# Patient Record
Sex: Female | Born: 1975 | Race: Black or African American | Hispanic: No | Marital: Married | Smoking: Former smoker
Health system: Southern US, Community
[De-identification: ages and names within clinical notes are randomized; demographics above are authoritative.]

## PROBLEM LIST (undated history)

## (undated) ENCOUNTER — Inpatient Hospital Stay (HOSPITAL_COMMUNITY): Payer: Self-pay

## (undated) DIAGNOSIS — H05119 Granuloma of unspecified orbit: Secondary | ICD-10-CM

## (undated) DIAGNOSIS — A599 Trichomoniasis, unspecified: Secondary | ICD-10-CM

## (undated) DIAGNOSIS — F419 Anxiety disorder, unspecified: Secondary | ICD-10-CM

## (undated) DIAGNOSIS — R51 Headache: Secondary | ICD-10-CM

## (undated) DIAGNOSIS — M2550 Pain in unspecified joint: Secondary | ICD-10-CM

## (undated) DIAGNOSIS — Z8719 Personal history of other diseases of the digestive system: Secondary | ICD-10-CM

## (undated) DIAGNOSIS — B019 Varicella without complication: Secondary | ICD-10-CM

## (undated) DIAGNOSIS — Z889 Allergy status to unspecified drugs, medicaments and biological substances status: Secondary | ICD-10-CM

## (undated) DIAGNOSIS — K219 Gastro-esophageal reflux disease without esophagitis: Secondary | ICD-10-CM

## (undated) DIAGNOSIS — J40 Bronchitis, not specified as acute or chronic: Secondary | ICD-10-CM

## (undated) DIAGNOSIS — Z6841 Body Mass Index (BMI) 40.0 and over, adult: Secondary | ICD-10-CM

## (undated) DIAGNOSIS — M1990 Unspecified osteoarthritis, unspecified site: Secondary | ICD-10-CM

## (undated) DIAGNOSIS — M549 Dorsalgia, unspecified: Secondary | ICD-10-CM

## (undated) DIAGNOSIS — IMO0002 Reserved for concepts with insufficient information to code with codable children: Secondary | ICD-10-CM

## (undated) DIAGNOSIS — Z8489 Family history of other specified conditions: Secondary | ICD-10-CM

## (undated) DIAGNOSIS — O26849 Uterine size-date discrepancy, unspecified trimester: Secondary | ICD-10-CM

## (undated) HISTORY — DX: Pain in unspecified joint: M25.50

## (undated) HISTORY — DX: Morbid (severe) obesity due to excess calories: E66.01

## (undated) HISTORY — DX: Body Mass Index (BMI) 40.0 and over, adult: Z684

## (undated) HISTORY — DX: Anxiety disorder, unspecified: F41.9

## (undated) HISTORY — DX: Trichomoniasis, unspecified: A59.9

## (undated) HISTORY — DX: Dorsalgia, unspecified: M54.9

## (undated) HISTORY — DX: Headache: R51

## (undated) HISTORY — PX: OTHER SURGICAL HISTORY: SHX169

## (undated) HISTORY — DX: Reserved for concepts with insufficient information to code with codable children: IMO0002

## (undated) HISTORY — DX: Uterine size-date discrepancy, unspecified trimester: O26.849

## (undated) HISTORY — DX: Varicella without complication: B01.9

---

## 1996-03-10 DIAGNOSIS — R87619 Unspecified abnormal cytological findings in specimens from cervix uteri: Secondary | ICD-10-CM

## 1996-03-10 DIAGNOSIS — IMO0002 Reserved for concepts with insufficient information to code with codable children: Secondary | ICD-10-CM

## 1996-03-10 HISTORY — DX: Reserved for concepts with insufficient information to code with codable children: IMO0002

## 1996-03-10 HISTORY — DX: Unspecified abnormal cytological findings in specimens from cervix uteri: R87.619

## 1997-06-13 ENCOUNTER — Ambulatory Visit (HOSPITAL_COMMUNITY): Admission: RE | Admit: 1997-06-13 | Discharge: 1997-06-13 | Payer: Self-pay | Admitting: Family Medicine

## 1998-02-09 ENCOUNTER — Inpatient Hospital Stay (HOSPITAL_COMMUNITY): Admission: AD | Admit: 1998-02-09 | Discharge: 1998-02-09 | Payer: Self-pay | Admitting: *Deleted

## 1998-02-10 ENCOUNTER — Inpatient Hospital Stay (HOSPITAL_COMMUNITY): Admission: AD | Admit: 1998-02-10 | Discharge: 1998-02-10 | Payer: Self-pay | Admitting: *Deleted

## 1998-12-12 ENCOUNTER — Inpatient Hospital Stay (HOSPITAL_COMMUNITY): Admission: AD | Admit: 1998-12-12 | Discharge: 1998-12-12 | Payer: Self-pay | Admitting: Obstetrics

## 2008-06-14 ENCOUNTER — Encounter: Admission: RE | Admit: 2008-06-14 | Discharge: 2008-06-14 | Payer: Self-pay | Admitting: Neurology

## 2008-06-20 ENCOUNTER — Ambulatory Visit (HOSPITAL_COMMUNITY): Admission: RE | Admit: 2008-06-20 | Discharge: 2008-06-20 | Payer: Self-pay | Admitting: Neurology

## 2009-02-14 ENCOUNTER — Encounter: Admission: RE | Admit: 2009-02-14 | Discharge: 2009-03-07 | Payer: Self-pay | Admitting: Internal Medicine

## 2011-07-29 ENCOUNTER — Emergency Department (HOSPITAL_COMMUNITY)
Admission: EM | Admit: 2011-07-29 | Discharge: 2011-07-29 | Disposition: A | Discharge status: Home / Self Care | Payer: BC Managed Care – PPO | Attending: Emergency Medicine | Admitting: Emergency Medicine

## 2011-07-29 DIAGNOSIS — R109 Unspecified abdominal pain: Secondary | ICD-10-CM | POA: Insufficient documentation

## 2011-07-29 DIAGNOSIS — E669 Obesity, unspecified: Secondary | ICD-10-CM | POA: Insufficient documentation

## 2011-07-29 DIAGNOSIS — N949 Unspecified condition associated with female genital organs and menstrual cycle: Secondary | ICD-10-CM | POA: Insufficient documentation

## 2011-07-29 LAB — URINALYSIS, ROUTINE W REFLEX MICROSCOPIC
Bilirubin Urine: NEGATIVE
Ketones, ur: NEGATIVE mg/dL
Nitrite: NEGATIVE
Protein, ur: NEGATIVE mg/dL
Urobilinogen, UA: 0.2 mg/dL (ref 0.0–1.0)
pH: 6.5 (ref 5.0–8.0)

## 2011-07-29 MED ORDER — IBUPROFEN 200 MG PO TABS
600.0000 mg | ORAL_TABLET | Freq: Once | ORAL | Status: AC
  Administered 2011-07-29: 600 mg via ORAL
  Filled 2011-07-29: qty 3

## 2011-07-29 MED ORDER — IBUPROFEN 600 MG PO TABS: 600.0000 mg | ORAL_TABLET | Freq: Four times a day (QID) | ORAL | Status: AC | PRN

## 2011-07-29 MED ORDER — HYDROCODONE-ACETAMINOPHEN 5-325 MG PO TABS
1.0000 | ORAL_TABLET | Freq: Once | ORAL | Status: AC
  Administered 2011-07-29: 1 via ORAL
  Filled 2011-07-29: qty 1

## 2011-07-29 MED ORDER — HYDROCODONE-ACETAMINOPHEN 5-325 MG PO TABS: 2.0000 | ORAL_TABLET | Freq: Once | ORAL | Status: DC

## 2011-07-29 NOTE — ED Notes (Signed)
abd pain, low right side- states happens during period, but not on period now. Periods are regular, heavy bleeding with cramping. Has had constipation for past couple days.

## 2011-07-29 NOTE — ED Provider Notes (Addendum)
History     CSN: [id number]  Arrival date & time 07/29/11  4098   First MD Initiated Contact with Patient 07/29/11 0848      Chief Complaint  Patient presents with  . Abdominal Pain    (Consider location/radiation/quality/duration/timing/severity/associated sxs/prior treatment) Patient is a 36 y.o. female presenting with abdominal pain. The history is provided by the patient.  Abdominal Pain The primary symptoms of the illness include abdominal pain. The primary symptoms of the illness do not include fever, shortness of breath, vomiting, diarrhea, dysuria, vaginal discharge or vaginal bleeding.  pt c/o lower abd/pelvic cramping pain every month for past 3-4 months at/around time of period. States is due to start period now, but no vaginal bleeding or discharge. lnmp 4/20-26. States pain will be bil pelvis/lower abd region, occasionally in lower back. No dysuria, urgency or freq. No cva area pain. No fever or chills. Has had normal appetite including today, no anorexia. No nvd. Had normal bm yesterday. No focal/unilateral pain. Denies hx ovarian cysts, uterine fibroids or endometriosis.  No past medical history on file.  No past surgical history on file.  No family history on file.  History  Substance Use Topics  . Smoking status: Not on file  . Smokeless tobacco: Not on file  . Alcohol Use: Not on file    OB History    No data available      Review of Systems  Constitutional: Negative for fever.  Respiratory: Negative for cough and shortness of breath.   Cardiovascular: Negative for chest pain.  Gastrointestinal: Positive for abdominal pain. Negative for vomiting and diarrhea.  Genitourinary: Negative for dysuria, vaginal bleeding and vaginal discharge.    Allergies  Fruit & vegetable daily  Home Medications   Current Outpatient Rx  Name Route Sig Dispense Refill  . ACETAMINOPHEN 500 MG PO TABS Oral Take 500 mg by mouth every 6 (six) hours as needed. pain    .  DICLOFENAC SODIUM 75 MG PO TBEC Oral Take 75 mg by mouth 2 (two) times daily.    Marland Kitchen HYDROCODONE-ACETAMINOPHEN 5-325 MG PO TABS Oral Take 1 tablet by mouth every 6 (six) hours as needed.    . ADULT MULTIVITAMIN W/MINERALS CH Oral Take 1 tablet by mouth daily.    Marland Kitchen OMEPRAZOLE 40 MG PO CPDR Oral Take 40 mg by mouth daily.    Marland Kitchen SIMETHICONE 80 MG PO CHEW Oral Chew 80 mg by mouth every 6 (six) hours as needed. gas    . TOPIRAMATE 25 MG PO TABS Oral Take 25 mg by mouth 2 (two) times daily.      BP 127/77  Pulse 90  Temp(Src) 97.7 F (36.5 C) (Oral)  Resp 20  SpO2 99%  Physical Exam  Nursing note and vitals reviewed. Constitutional: She is oriented to person, place, and time. She appears well-developed and well-nourished. No distress.  Eyes: Conjunctivae are normal. No scleral icterus.  Neck: Neck supple. No tracheal deviation present.  Cardiovascular: Normal rate, regular rhythm, normal heart sounds and intact distal pulses.   Pulmonary/Chest: Effort normal and breath sounds normal. No respiratory distress.  Abdominal: Soft. Normal appearance and bowel sounds are normal. She exhibits no distension and no mass. There is no tenderness. There is no rebound and no guarding.       Obese. No hernia.   Genitourinary:       No cva tenderness  Musculoskeletal: She exhibits no edema.  Neurological: She is alert and oriented to person, place, and time.  Skin: Skin is warm and dry. No rash noted.  Psychiatric: She has a normal mood and affect.    ED Course  Procedures (including critical care time)   Labs Reviewed  URINALYSIS, ROUTINE W REFLEX MICROSCOPIC   Results for orders placed during the hospital encounter of 07/29/11  URINALYSIS, ROUTINE W REFLEX MICROSCOPIC      Component Value Range   Color, Urine YELLOW  YELLOW    APPearance CLEAR  CLEAR    Specific Gravity, Urine 1.004 (*) 1.005 - 1.030    pH 6.5  5.0 - 8.0    Glucose, UA NEGATIVE  NEGATIVE (mg/dL)   Hgb urine dipstick NEGATIVE   NEGATIVE    Bilirubin Urine NEGATIVE  NEGATIVE    Ketones, ur NEGATIVE  NEGATIVE (mg/dL)   Protein, ur NEGATIVE  NEGATIVE (mg/dL)   Urobilinogen, UA 0.2  0.0 - 1.0 (mg/dL)   Nitrite NEGATIVE  NEGATIVE    Leukocytes, UA NEGATIVE  NEGATIVE   POCT PREGNANCY, URINE      Component Value Range   Preg Test, Ur NEGATIVE  NEGATIVE         MDM  Labs.  Pt says has ride, did not drive, has not taken anything for pain today.  Reports normal appetite, no fever, abd soft nt.    u preg neg. Feel symptoms most likely related to menstrual cycles, given heavy periods/pain w periods, ?fibroids.   Discussed need for gyn followup.   vicodin 1 po, motrin po.   Discussed diff dx w pt, incl possible uterine fibroids, to return if new symptoms, fevers, worsening pain, vomitnig.      Suzi Roots, MD 07/29/11 4098  Suzi Roots, MD 07/29/11 [phone]

## 2011-07-29 NOTE — Discharge Instructions (Signed)
Take motrin as need for pain. You may also take your vicodin as need for pain. No driving for the next 6 hours or when taking vicodin. Also, do not take tylenol or acetaminophen containing medication when taking vicodin. It is possible that your symptoms could be the result of uterine fibroids, follow up with ob/gyn doctor in the next 1-2 weeks. Return to ER if worse, new symptoms, fevers, persistent vomiting, worsening/severe pain, other concern.       Abdominal Pain Abdominal pain can be caused by many things. Your caregiver decides the seriousness of your pain by an examination and possibly blood tests and X-rays. Many cases can be observed and treated at home. Most abdominal pain is not caused by a disease and will probably improve without treatment. However, in many cases, more time must pass before a clear cause of the pain can be found. Before that point, it may not be known if you need more testing, or if hospitalization or surgery is needed. HOME CARE INSTRUCTIONS   Do not take laxatives unless directed by your caregiver.   Take pain medicine only as directed by your caregiver.   Only take over-the-counter or prescription medicines for pain, discomfort, or fever as directed by your caregiver.   Try a clear liquid diet (broth, tea, or water) for as long as directed by your caregiver. Slowly move to a bland diet as tolerated.  SEEK IMMEDIATE MEDICAL CARE IF:   The pain does not go away.   You have a fever.   You keep throwing up (vomiting).   The pain is felt only in portions of the abdomen. Pain in the right side could possibly be appendicitis. In an adult, pain in the left lower portion of the abdomen could be colitis or diverticulitis.   You pass bloody or black tarry stools.  MAKE SURE YOU:   Understand these instructions.   Will watch your condition.   Will get help right away if you are not doing well or get worse.  Document Released: 12/04/2004 Document Revised:  02/13/2011 Document Reviewed: 10/13/2007 [facility] Patient Information 2012 [place], [place].      Fibroids You have been diagnosed as having a fibroid. Fibroids are smooth muscle lumps (tumors) which can occur any place in a woman's body. They are usually in the womb (uterus). The most common problem (symptom) of fibroids is bleeding. Over time this may cause low red blood cells (anemia). Other symptoms include feelings of pressure and pain in the pelvis. The diagnosis (learning what is wrong) of fibroids is made by physical exam. Sometimes tests such as an ultrasound are used. This is helpful when fibroids are felt around the ovaries and to look for tumors. TREATMENT   Most fibroids do not need surgical or medical treatment. Sometimes a tissue sample (biopsy) of the lining of the uterus is done to rule out cancer. If there is no cancer and only a small amount of bleeding, the problem can be watched.   Hormonal treatment can improve the problem.   When surgery is needed, it can consist of removing the fibroid. Vaginal birth may not be possible after the removal of fibroids. This depends on where they are and the extent of surgery. When pregnancy occurs with fibroids it is usually normal.   Your caregiver can help decide which treatments are best for you.  HOME CARE INSTRUCTIONS   Do not use aspirin as this may increase bleeding problems.   If your periods (menses) are heavy,  record the number of pads or tampons used per month. Bring this information to your caregiver. This can help them determine the best treatment for you.  SEEK IMMEDIATE MEDICAL CARE IF:  You have pelvic pain or cramps not controlled with medications, or experience a sudden increase in pain.   You have an increase of pelvic bleeding between and during menses.   You feel lightheaded or have fainting spells.   You develop worsening belly (abdominal) pain.  Document Released: 02/22/2000 Document Revised: 02/13/2011  Document Reviewed: 10/14/2007 [facility] Patient Information 2012 [place], [place].

## 2011-07-30 ENCOUNTER — Encounter: Payer: Self-pay | Admitting: Obstetrics and Gynecology

## 2011-07-30 ENCOUNTER — Other Ambulatory Visit: Payer: Self-pay | Admitting: Obstetrics and Gynecology

## 2011-07-30 ENCOUNTER — Ambulatory Visit (INDEPENDENT_AMBULATORY_CARE_PROVIDER_SITE_OTHER): Payer: BC Managed Care – PPO | Admitting: Obstetrics and Gynecology

## 2011-07-30 VITALS — BP 116/82 | Temp 98.3°F | Resp 16 | Ht 66.0 in | Wt 386.0 lb

## 2011-07-30 DIAGNOSIS — Z124 Encounter for screening for malignant neoplasm of cervix: Secondary | ICD-10-CM

## 2011-07-30 DIAGNOSIS — N946 Dysmenorrhea, unspecified: Secondary | ICD-10-CM

## 2011-07-30 DIAGNOSIS — N920 Excessive and frequent menstruation with regular cycle: Secondary | ICD-10-CM

## 2011-07-30 DIAGNOSIS — R102 Pelvic and perineal pain: Secondary | ICD-10-CM

## 2011-07-30 DIAGNOSIS — A599 Trichomoniasis, unspecified: Secondary | ICD-10-CM | POA: Insufficient documentation

## 2011-07-30 DIAGNOSIS — N949 Unspecified condition associated with female genital organs and menstrual cycle: Secondary | ICD-10-CM

## 2011-07-30 HISTORY — DX: Morbid (severe) obesity due to excess calories: E66.01

## 2011-07-30 HISTORY — DX: Dysmenorrhea, unspecified: N94.6

## 2011-07-30 HISTORY — DX: Excessive and frequent menstruation with regular cycle: N92.0

## 2011-07-30 LAB — POCT URINALYSIS DIPSTICK
Ketones, UA: NEGATIVE
Protein, UA: NEGATIVE
Spec Grav, UA: 1.015

## 2011-07-30 NOTE — Progress Notes (Signed)
Last Pap: 2012 WNL: Yes Regular Periods:yes Contraception: Condoms  Monthly Breast exam:no Tetanus<20yrs:yes Nl.Bladder Function:no Daily BMs:yes Healthy Diet:no Calcium:yes Mammogram:no Exercise:Walking sometimes Seatbelt: yes Abuse at home: no Stressful work:yes Sigmoid-colonoscopy: per pt Never Bone Density: No      Vag. Discharge:no Odor:no Fever:no Irreg.Periods:no Dyspareunia:no Dysuria:no Frequency:no Urgency:yes Hematuria:no Kidney stones:no Constipation:yes Diarrhea:yes Rectal Bleeding: no Vomiting:yes Nausea:yes Pregnant:no Fibroids:no "Unsure" Endometriosis:no Hx of Ovarian Cyst:no Hx IUD:no Hx STD-PID:yes Appendectomy:no Gall Bladder Dz:no  Subjective:    Katherine Baldwin is a 36 y.o. female, G0P0000, who presents for evaluation of severe pelvic pain over the last year, primarily with menses.  The pain became so severe yesterday that she presented to the [facility] ED where the evaluation included only a negative pregnancy test and a negative urine.    History   Social History  . Marital Status: Single    Spouse Name: N/A    Number of Children: N/A  . Years of Education: 16   Occupational History  . teacher [employer]   Social History Main Topics  . Smoking status: Current Some Day Smoker -- 0.5 packs/day    Types: Cigarettes  . Smokeless tobacco: Never Used  . Alcohol Use: No  . Drug Use: No  . Sexually Active: Yes    Birth Control/ Protection: Condom   Other Topics Concern  . None   Social History Narrative  . None    Menstrual cycle:   LMP: Patient's last menstrual period was 07/04/2011. Started bleeding 07/29/11 with severe pain           Cycle: flow is excessive with use of 12 pads or tampons on heaviest days,ometimes soiling clothes and linens.  Menses are irregular occurring approximately every 30 days without intermenstrual spotting, usually lasting 3 to 5 days and with severe dysmenorrhea.  She also experienced  low back pain and constipation with menses.  The patient used Nuvaring with some success, but discontinued it because of expense.  The following portions of the patient's history were reviewed and updated as appropriate: allergies, current medications, past family history, past medical history, past social history, past surgical history and problem list.  Review of Systems Pertinent items are noted in HPI. Breast:Negative for breast lump,nipple discharge or nipple retraction Gastrointestinal: Negative for abdominal pain, change in bowel habits or rectal bleeding Urinary:negative   Objective:    BP 116/82  Temp(Src) 98.3 F (36.8 C) (Oral)  Resp 16  Ht 5\' 6"  (1.676 m)  Wt 386 lb (175.088 kg)  BMI 62.30 kg/m2  LMP 07/04/2011    Weight:  Wt Readings from Last 1 Encounters:  07/30/11 386 lb (175.088 kg)          BMI: Body mass index is 62.30 kg/(m^2).  General Appearance: Alert, appropriate appearance for age. No acute distress HEENT: Grossly normal Neck / Thyroid: Supple, no masses, nodes or enlargement Lungs: clear to auscultation bilaterally Back: No CVA tenderness Breast Exam: No masses or nodes.No dimpling, nipple retraction or discharge. Cardiovascular: Regular rate and rhythm. S1, S2, no murmur Gastrointestinal: Soft, non-tender, no masses or organomegaly. Exam is compromised by patient habitus Pelvic Exam: External genitalia: normal general appearance Vaginal: normal mucosa without prolapse or lesions Cervix: bearly visible secondary to patient habitus Adnexa: non palpable Uterus: compromised by patient habitus Rectovaginal: normal rectal, no masses Lymphatic Exam: Non-palpable nodes in neck, clavicular, axillary, or inguinal regions Skin: no rash or abnormalities Neurologic: Normal gait and speech, no tremor  Psychiatric: Alert and oriented, appropriate  affect.   Wet Prep:not applicable Urinalysis:not applicable UPT: Not done   Assessment:    severe dysmenorrhea    Morbid obesity compromising examination Next menstrual irregularity possibly secondary to chronic anovulation Family history of uterine fibroid  rule out endometriosis   Plan:     pap smear high risk HPV Return for pelvic ultrasound or prn STD screening: done, discussed Contraception:no method.  Will discuss at next visit Info re fibroids and endometriosis given      Katherine Baldwin PMD

## 2011-07-30 NOTE — Patient Instructions (Signed)
Endometriosis Endometriosis is a disease that occurs when the endometrium (lining of the uterus) is misplaced outside of its normal location. It may occur in many locations close to the uterus (womb), but commonly on the ovaries, fallopian tubes, vagina (birth canal) and bowel located close to the uterus. Because the uterus sloughs (expels) its lining every month (menses), there is bleeding whereever the endometrial tissue is located. SYMPTOMS  Often there are no symptoms. However, because blood is irritating to tissues not normally exposed to it, when symptoms occur they vary with the location of the misplaced endometrium. Symptoms often include back and abdominal pain. Periods may be heavier and intercourse may be painful. Infertility may be present. You may have all of these symptoms at one time or another or you may have months with no symptoms at all. Although the symptoms occur mainly during menses, they can occur mid-cycle as well, and usually terminate with menopause. DIAGNOSIS  Your caregiver may recommend a blood test and urine test (urinalysis) to help rule out other conditions. Another common test is ultrasound, a painless procedure that uses sound waves to make a sonogram "picture" of abnormal tissue that could be endometriosis. If your bowel movements are painful around your periods, your caregiver may advise a barium enema (an X-ray of the lower bowel), to try to find the source of your pain. This is sometimes confirmed by laparoscopy. Laparoscopy is a procedure where your caregiver looks into your abdomen with a laparoscope (a small pencil sized telescope). Your caregiver may take a tiny piece of tissue (biopsy) from any abnormal tissue to confirm or document your problem. These tissues are sent to the lab and a pathologist looks at them under the microscope to give a microscopic diagnosis. TREATMENT  Once the diagnosis is made, it can be treated by destruction of the misplaced endometrial  tissue using heat (diathermy), laser, cutting (excision), or chemical means. It may also be treated with hormonal therapy. When using hormonal therapy menses are eliminated, therefore eliminating the monthly exposure to blood by the misplaced endometrial tissue. Only in severe cases is it necessary to perform a hysterectomy with removal of the tubes, uterus and ovaries. HOME CARE INSTRUCTIONS   Only take over-the-counter or prescription medicines for pain, discomfort, or fever as directed by your caregiver.   Avoid activities that produce pain, including a physical sexual relationship.   Do not take aspirin as this may increase bleeding when not on hormonal therapy.   See your caregiver for pain or problems not controlled with treatment.  SEEK IMMEDIATE MEDICAL CARE IF:   Your pain is severe and is not responding to pain medicine.   You develop severe nausea and vomiting, or you cannot keep foods down.   Your pain localizes to the right lower part of your abdomen (possible appendicitis).   You have swelling or increasing pain in the abdomen.   You have a fever.   You see blood in your stool.  MAKE SURE YOU:   Understand these instructions.   Will watch your condition.   Will get help right away if you are not doing well or get worse.  Document Released: 02/22/2000 Document Revised: 02/13/2011 Document Reviewed: 10/13/2007 [facility] Patient Information 2012 [place], [place].Fibroids You have been diagnosed as having a fibroid. Fibroids are smooth muscle lumps (tumors) which can occur any place in a woman's body. They are usually in the womb (uterus). The most common problem (symptom) of fibroids is bleeding. Over time this may cause low  red blood cells (anemia). Other symptoms include feelings of pressure and pain in the pelvis. The diagnosis (learning what is wrong) of fibroids is made by physical exam. Sometimes tests such as an ultrasound are used. This is helpful when fibroids are  felt around the ovaries and to look for tumors. TREATMENT   Most fibroids do not need surgical or medical treatment. Sometimes a tissue sample (biopsy) of the lining of the uterus is done to rule out cancer. If there is no cancer and only a small amount of bleeding, the problem can be watched.   Hormonal treatment can improve the problem.   When surgery is needed, it can consist of removing the fibroid. Vaginal birth may not be possible after the removal of fibroids. This depends on where they are and the extent of surgery. When pregnancy occurs with fibroids it is usually normal.   Your caregiver can help decide which treatments are best for you.  HOME CARE INSTRUCTIONS   Do not use aspirin as this may increase bleeding problems.   If your periods (menses) are heavy, record the number of pads or tampons used per month. Bring this information to your caregiver. This can help them determine the best treatment for you.  SEEK IMMEDIATE MEDICAL CARE IF:  You have pelvic pain or cramps not controlled with medications, or experience a sudden increase in pain.   You have an increase of pelvic bleeding between and during menses.   You feel lightheaded or have fainting spells.   You develop worsening belly (abdominal) pain.  Document Released: 02/22/2000 Document Revised: 02/13/2011 Document Reviewed: 10/14/2007 [facility] Patient Information 2012 [place], [place].

## 2011-08-07 LAB — PAP IG, CT-NG NAA, HPV HIGH-RISK
GC Probe Amp: NEGATIVE
HPV DNA High Risk: DETECTED — AB

## 2011-08-14 ENCOUNTER — Other Ambulatory Visit: Payer: Self-pay | Admitting: Obstetrics and Gynecology

## 2011-08-14 ENCOUNTER — Ambulatory Visit (INDEPENDENT_AMBULATORY_CARE_PROVIDER_SITE_OTHER): Payer: BC Managed Care – PPO | Admitting: Obstetrics and Gynecology

## 2011-08-14 ENCOUNTER — Encounter: Payer: Self-pay | Admitting: Obstetrics and Gynecology

## 2011-08-14 ENCOUNTER — Ambulatory Visit (INDEPENDENT_AMBULATORY_CARE_PROVIDER_SITE_OTHER): Payer: BC Managed Care – PPO

## 2011-08-14 VITALS — BP 120/78 | Ht 66.5 in | Wt 398.0 lb

## 2011-08-14 DIAGNOSIS — N946 Dysmenorrhea, unspecified: Secondary | ICD-10-CM

## 2011-08-14 DIAGNOSIS — N949 Unspecified condition associated with female genital organs and menstrual cycle: Secondary | ICD-10-CM

## 2011-08-14 DIAGNOSIS — R102 Pelvic and perineal pain: Secondary | ICD-10-CM

## 2011-08-14 DIAGNOSIS — N920 Excessive and frequent menstruation with regular cycle: Secondary | ICD-10-CM

## 2011-08-14 MED ORDER — LEVONORGESTREL-ETHINYL ESTRAD 0.1-20 MG-MCG PO TABS: ORAL_TABLET | ORAL | Status: DC

## 2011-08-14 NOTE — Progress Notes (Signed)
F/u for US pelvic pain.  ULTRASOUND: No uterine masses or fibroids noted.  Both ovaries nl. No adnexal masses or free fluid  LMP:   Severe cramps, but not as bad as before  IMPRESSION: Severe pelvic pain without evidence of fibroids, cannot rule out endometriosis  RECOMMENDATION: Presume endometriosis and Rx with extended cycle BCP's.. Pt has used Nuvaring in past but did not like it Alesse extended cycle F/U 6 weeks

## 2011-09-25 ENCOUNTER — Encounter: Payer: BC Managed Care – PPO | Admitting: Obstetrics and Gynecology

## 2012-02-11 ENCOUNTER — Encounter (INDEPENDENT_AMBULATORY_CARE_PROVIDER_SITE_OTHER): Payer: Self-pay | Admitting: General Surgery

## 2012-02-11 ENCOUNTER — Ambulatory Visit (INDEPENDENT_AMBULATORY_CARE_PROVIDER_SITE_OTHER): Payer: BC Managed Care – PPO | Admitting: General Surgery

## 2012-02-11 DIAGNOSIS — K2100 Gastro-esophageal reflux disease with esophagitis, without bleeding: Secondary | ICD-10-CM

## 2012-02-11 DIAGNOSIS — Z6841 Body Mass Index (BMI) 40.0 and over, adult: Secondary | ICD-10-CM

## 2012-02-11 NOTE — Progress Notes (Signed)
Patient ID: Katherine Baldwin, female   DOB: June 05, 1975, 36 y.o.   MRN: [id number]  Chief Complaint  Patient presents with  . Bariatric Pre-op    initial    HPI Katherine Baldwin is a 36 y.o. female.  This patient presents for her initial bariatric surgery consultation. She has a BMI of 61 and has struggled with her weight for most of her life. She has obesity-related comorbidities of arthritis, GERD, and pseudotumor cerebri.  She is interested in the lap band. She has tried several diets and weight loss attempts she has been to a nutritionist and to a bariatric Center twice. She has done several other diets including portion controlled and Weight Watchers. She is currently doing a locking video at home for exercise but has trouble with her knees. She has a history of smoking but quit 5 months ago. HPI  Past Medical History  Diagnosis Date  . Trichomonas   . Headache   . Chicken pox   . Abnormal Pap smear 1998    no treatment required  . Morbid obesity with body mass index of 60.0-69.9 in adult     Past Surgical History  Procedure Date  . Spinal tap     Family History  Problem Relation Age of Onset  . Diabetes Paternal Grandfather   . Diabetes Paternal Grandmother   . Diabetes Maternal Grandmother   . Diabetes Maternal Grandfather   . Cancer Father     prostate, gallbladder, prostate breast, stomach  . Arthritis Father     rhematoid  . Diabetes Mother   . Asthma Mother   . Hypertension Mother   . Fibroids Sister   . Thyroid disease Sister     Social History History  Substance Use Topics  . Smoking status: Current Some Day Smoker -- 0.5 packs/day    Types: Cigarettes  . Smokeless tobacco: Never Used  . Alcohol Use: No    Allergies  Allergen Reactions  . Fruit & Vegetable Daily (Nutritional Supplements)     Fruits and uncooked vegetables-itchy mouth and throat    Current Outpatient Prescriptions  Medication Sig Dispense Refill  . acetaminophen (TYLENOL) 500 MG  tablet Take 500 mg by mouth every 6 (six) hours as needed. pain      . diclofenac (VOLTAREN) 75 MG EC tablet Take 75 mg by mouth 2 (two) times daily.      Marland Kitchen HYDROcodone-acetaminophen (NORCO) 5-325 MG per tablet Take 1 tablet by mouth every 6 (six) hours as needed.      Marland Kitchen levonorgestrel-ethinyl estradiol (AVIANE,ALESSE,LESSINA) 0.1-20 MG-MCG tablet One active pill daily for 12 consecutive weeks, then 3 placebo pills.  Restart that regimen again. This will require pt to have 4 packages of pills each 3 months.  4 Package  3  . meloxicam (MOBIC) 7.5 MG tablet       . Multiple Vitamin (MULITIVITAMIN WITH MINERALS) TABS Take 1 tablet by mouth daily.      Marland Kitchen omeprazole (PRILOSEC) 40 MG capsule Take 40 mg by mouth daily.      . simethicone (MYLICON) 80 MG chewable tablet Chew 80 mg by mouth every 6 (six) hours as needed. gas      . topiramate (TOPAMAX) 25 MG tablet Take 25 mg by mouth 2 (two) times daily.        Review of Systems Review of Systems All other review of systems negative or noncontributory except as stated in the HPI  Blood pressure 112/78, pulse 98, temperature 97.7 F (  36.5 C), temperature source Temporal, resp. rate 16, height 5\' 7"  (1.702 m), weight 392 lb 3.2 oz (177.901 kg), SpO2 99.00%.  Physical Exam Physical Exam Physical Exam  Nursing note and vitals reviewed. Constitutional: She is oriented to person, place, and time. She appears well-developed and well-nourished. No distress.  HENT:  Head: Normocephalic and atraumatic.  Mouth/Throat: No oropharyngeal exudate.  Eyes: Conjunctivae and EOM are normal. Pupils are equal, round, and reactive to light. Right eye exhibits no discharge. Left eye exhibits no discharge. No scleral icterus.  Neck: Normal range of motion. Neck supple. No tracheal deviation present.  Cardiovascular: Normal rate, regular rhythm, normal heart sounds and intact distal pulses.   Pulmonary/Chest: Effort normal and breath sounds normal. No stridor. No  respiratory distress. She has no wheezes.  Abdominal: Soft. Bowel sounds are normal. She exhibits no distension and no mass. There is no tenderness. There is no rebound and no guarding.  Musculoskeletal: Normal range of motion. She exhibits no edema and no tenderness.  Neurological: She is alert and oriented to person, place, and time.  Skin: Skin is warm and dry. No rash noted. She is not diaphoretic. No erythema. No pallor.  Psychiatric: She has a normal mood and affect. Her behavior is normal. Judgment and thought content normal.     Data Reviewed   Assessment    morbid obesity with a BMI of 61 and comorbidities of arthritis, GERD, pseudotumor cerebri As we discussion 45 minutes about the medical and surgical options for weight loss. We discussed the LAP-BAND, sleeve gastrectomy, and the Roux-en-Y gastric bypass and the pros and cons of each.  She was originally interested in lap band but she is leaning towards the sleeve gastrectomy. We discussed the risks of the procedure. The risks of infection, bleeding, pain, scarring, weight regain, too little or too much weight loss, vitamin deficiencies and need for lifelong vitamin supplementation, hair loss, need for protein supplementation, leaks, stricture, reflux, food intolerance, need for reoperation and conversion to roux Y gastric bypass, need for open surgery, injury to spleen or surrounding structures, DVT's, PE, and death again discussed with the patient and the patient expressed understanding and desires to proceed with laparoscopic vertical sleeve gastrectomy, possible open, intraoperative endoscopy. She does have a history of reflux which sounds like it is due to her NSAID use. Because of her need for NSAIDs for her arthritis, gastric bypass may not be the best option for her although this may be the best option for her for overall weight loss and for her reflux. Nothing that she isn't really interested in the lap band anymore due to the  maintenance and she has done some reading about the complications and long-term issues with the band and she is leaning towards the sleeve. We discussed the possibility of worsening reflux and the possible need for conversion to Roux-en-Y gastric bypass. Before the information session, she was not familiar with the sleeve and so I recommended that she do some additional research on the sleeve but we will go ahead and start with her preoperative workup.     Plan    We will go ahead and start with the preoperative evaluation with laboratory studies, upper GI, nutrition and psychology evaluation.       Lodema Pilot DAVID 02/11/2012, 5:15 PM

## 2012-02-14 LAB — CBC WITH DIFFERENTIAL/PLATELET
Eosinophils Absolute: 0.1 10*3/uL (ref 0.0–0.7)
Eosinophils Relative: 1 % (ref 0–5)
Hemoglobin: 12.5 g/dL (ref 12.0–15.0)
Lymphs Abs: 2.9 10*3/uL (ref 0.7–4.0)
MCH: 26.6 pg (ref 26.0–34.0)
MCV: 78.9 fL (ref 78.0–100.0)
Monocytes Absolute: 0.4 10*3/uL (ref 0.1–1.0)
Monocytes Relative: 5 % (ref 3–12)
Platelets: 333 10*3/uL (ref 150–400)
RBC: 4.7 MIL/uL (ref 3.87–5.11)

## 2012-02-14 LAB — COMPREHENSIVE METABOLIC PANEL
BUN: 14 mg/dL (ref 6–23)
CO2: 22 mEq/L (ref 19–32)
Creat: 0.75 mg/dL (ref 0.50–1.10)
Glucose, Bld: 84 mg/dL (ref 70–99)
Total Bilirubin: 0.4 mg/dL (ref 0.3–1.2)

## 2012-02-14 LAB — LIPID PANEL
Cholesterol: 143 mg/dL (ref 0–200)
Total CHOL/HDL Ratio: 3.7 Ratio
Triglycerides: 82 mg/dL (ref <150)
VLDL: 16 mg/dL (ref 0–40)

## 2012-02-16 LAB — H. PYLORI ANTIBODY, IGG: H Pylori IgG: <0.4 {ISR}

## 2012-02-18 ENCOUNTER — Encounter: Payer: BC Managed Care – PPO | Attending: General Surgery | Admitting: *Deleted

## 2012-02-18 ENCOUNTER — Encounter: Payer: Self-pay | Admitting: *Deleted

## 2012-02-18 DIAGNOSIS — Z713 Dietary counseling and surveillance: Secondary | ICD-10-CM | POA: Insufficient documentation

## 2012-02-18 DIAGNOSIS — Z01818 Encounter for other preprocedural examination: Secondary | ICD-10-CM | POA: Insufficient documentation

## 2012-02-18 NOTE — Patient Instructions (Addendum)
   Follow Pre-Op Nutrition Goals to prepare for Gastric Sleeve Surgery.   Call the [facility] at [phone] once you have been given your surgery date to enrolled in the Pre-Op Nutrition Class. You will need to attend this nutrition class 3-4 weeks prior to your surgery.  

## 2012-02-18 NOTE — Progress Notes (Signed)
  Pre-Op Assessment Visit:  Pre-Operative Gastric Sleeve Surgery  Medical Nutrition Therapy:  Appt start time: 1100   End time:  1200.  Patient was seen on 02/18/2012 for Pre-Operative Gastric Sleeve Nutrition Assessment. Assessment and letter of approval faxed to [facility] Bariat[facility] Program coordinator on 02/18/2012.  Approval letter sent to [facility] center and will be available in the chart under the media tab.  Handouts given during visit include:  Pre-Op Goals   Bariatric Surgery Protein Shakes  Patient to call for Pre-Op and Post-Op Nutrition Education at the [facility] when surgery is scheduled.

## 2012-02-20 ENCOUNTER — Ambulatory Visit (INDEPENDENT_AMBULATORY_CARE_PROVIDER_SITE_OTHER): Payer: BC Managed Care – PPO | Admitting: General Surgery

## 2012-02-25 ENCOUNTER — Ambulatory Visit (HOSPITAL_COMMUNITY): Payer: BC Managed Care – PPO

## 2012-03-26 ENCOUNTER — Ambulatory Visit (HOSPITAL_COMMUNITY)
Admission: RE | Admit: 2012-03-26 | Discharge: 2012-03-26 | Disposition: A | Discharge status: Home / Self Care | Payer: BC Managed Care – PPO | Source: Ambulatory Visit | Attending: General Surgery | Admitting: General Surgery

## 2012-03-26 DIAGNOSIS — M129 Arthropathy, unspecified: Secondary | ICD-10-CM | POA: Insufficient documentation

## 2012-03-26 DIAGNOSIS — G932 Benign intracranial hypertension: Secondary | ICD-10-CM | POA: Insufficient documentation

## 2012-03-26 DIAGNOSIS — K219 Gastro-esophageal reflux disease without esophagitis: Secondary | ICD-10-CM | POA: Insufficient documentation

## 2012-03-26 DIAGNOSIS — K449 Diaphragmatic hernia without obstruction or gangrene: Secondary | ICD-10-CM | POA: Insufficient documentation

## 2012-03-26 DIAGNOSIS — Z6841 Body Mass Index (BMI) 40.0 and over, adult: Secondary | ICD-10-CM | POA: Insufficient documentation

## 2012-06-01 ENCOUNTER — Telehealth: Payer: Self-pay | Admitting: *Deleted

## 2012-06-01 ENCOUNTER — Other Ambulatory Visit (INDEPENDENT_AMBULATORY_CARE_PROVIDER_SITE_OTHER): Payer: Self-pay | Admitting: General Surgery

## 2012-06-01 NOTE — Telephone Encounter (Signed)
Left message for patient to return call.

## 2012-06-01 NOTE — Telephone Encounter (Signed)
Patient is calling because she is scheduled to have weight loss sx in June and scheduled to see CM in July and she has some questions about her medicines and her appointment. Please call the patient.

## 2012-07-27 ENCOUNTER — Telehealth (INDEPENDENT_AMBULATORY_CARE_PROVIDER_SITE_OTHER): Payer: Self-pay

## 2012-07-27 NOTE — Telephone Encounter (Signed)
Message copied by Maryan Puls on Tue Jul 27, 2012  8:49 AM ------      Message from: Marnette Burgess      Created: Wed Jul 07, 2012  4:29 PM       PT HAS APPT 08/20/2012 FOR PREOP AND SHE NEEDS TO CHANGE LATE DAY BECAUSE SHE IS IN THE SCHOOL.      PLEASE RETURN PATIENT CALL. ------

## 2012-07-27 NOTE — Telephone Encounter (Signed)
Called and left message for patient to call our office.  Patient requesting to r/s appt to later in the day.  Please make patient aware that the latest appointment is 12:15 on 08/20/12, Dr. Delice Lesch not available any other day before patient surgery.  She will need to keep appt for that day either same time or change to 12:15.

## 2012-08-12 ENCOUNTER — Encounter: Payer: BC Managed Care – PPO | Attending: General Surgery

## 2012-08-19 ENCOUNTER — Ambulatory Visit (INDEPENDENT_AMBULATORY_CARE_PROVIDER_SITE_OTHER): Payer: BC Managed Care – PPO | Admitting: General Surgery

## 2012-08-20 ENCOUNTER — Ambulatory Visit (INDEPENDENT_AMBULATORY_CARE_PROVIDER_SITE_OTHER): Payer: BC Managed Care – PPO | Admitting: General Surgery

## 2012-08-20 ENCOUNTER — Encounter (INDEPENDENT_AMBULATORY_CARE_PROVIDER_SITE_OTHER): Payer: Self-pay | Admitting: General Surgery

## 2012-08-20 ENCOUNTER — Telehealth (INDEPENDENT_AMBULATORY_CARE_PROVIDER_SITE_OTHER): Payer: Self-pay

## 2012-08-20 NOTE — Progress Notes (Signed)
Patient ID: Katherine Baldwin, female   DOB: 05/21/1975, 37 y.o.   MRN: [id number]  Chief Complaint  Patient presents with  . Bariatric Pre-op    pre op gastric sleeve    HPI Katherine Baldwin is a 37 y.o. female. This patient comes in for her preoperative surgery evaluation in preparation for sleeve gastrectomy. She has a BMI of 59 with obesity related comorbidities of GERD, arthritis, and pseudotumor cerebri. She has started her preoperative diet and remains interested in sleeve gastrectomy. She has done a lot of research online and remains interested in the sleeve. She did have reflux on her upper GI and some reflux after coming off of her PPIs. HPI  Past Medical History  Diagnosis Date  . Trichomonas   . Headache(784.0)   . Chicken pox   . Abnormal Pap smear 1998    no treatment required  . Morbid obesity with body mass index of 60.0-69.9 in adult     Past Surgical History  Procedure Laterality Date  . Spinal tap      Family History  Problem Relation Age of Onset  . Diabetes Paternal Grandfather   . Diabetes Paternal Grandmother   . Diabetes Maternal Grandmother   . Diabetes Maternal Grandfather   . Cancer Father     prostate, gallbladder, prostate breast, stomach  . Arthritis Father     rhematoid  . Diabetes Father     Prediabetes  . Diabetes Mother   . Asthma Mother   . Hypertension Mother   . Fibroids Sister   . Thyroid disease Sister     Social History History  Substance Use Topics  . Smoking status: Former Smoker -- 0.50 packs/day    Types: Cigarettes    Quit date: 03/10/2010  . Smokeless tobacco: Never Used  . Alcohol Use: No    Allergies  Allergen Reactions  . Fruit & Vegetable Daily (Nutritional Supplements)     Fruits and uncooked vegetables-itchy mouth and throat. Patient reports this is transient and she never knows which foods will have a rxn    Current Outpatient Prescriptions  Medication Sig Dispense Refill  . acetaminophen (TYLENOL) 500  MG tablet Take 500 mg by mouth every 6 (six) hours as needed. pain      . HYDROcodone-acetaminophen (NORCO) 5-325 MG per tablet Take 1 tablet by mouth every 6 (six) hours as needed.      . Multiple Vitamin (MULITIVITAMIN WITH MINERALS) TABS Take 1 tablet by mouth daily.      . omeprazole (PRILOSEC) 40 MG capsule Take 40 mg by mouth daily.      . topiramate (TOPAMAX) 25 MG tablet Take 25 mg by mouth 2 (two) times daily.      . diclofenac (VOLTAREN) 75 MG EC tablet Take 75 mg by mouth 2 (two) times daily.      . meloxicam (MOBIC) 7.5 MG tablet        No current facility-administered medications for this visit.    Review of Systems Review of Systems All other review of systems negative or noncontributory except as stated in the HPI  Blood pressure 112/84, pulse 76, temperature 97.6 F (36.4 C), temperature source Temporal, resp. rate 16, height 5' 6" (1.676 m), weight 365 lb 3.2 oz (165.654 kg).  Physical Exam Physical Exam Physical Exam  Nursing note and vitals reviewed. Constitutional: She is oriented to person, place, and time. She appears well-developed and well-nourished. No distress.  HENT:  Head: Normocephalic and atraumatic.  Mouth/Throat:   No oropharyngeal exudate.  Eyes: Conjunctivae and EOM are normal. Pupils are equal, round, and reactive to light. Right eye exhibits no discharge. Left eye exhibits no discharge. No scleral icterus.  Neck: Normal range of motion. Neck supple. No tracheal deviation present.  Cardiovascular: Normal rate, regular rhythm, normal heart sounds and intact distal pulses.   Pulmonary/Chest: Effort normal and breath sounds normal. No stridor. No respiratory distress. She has no wheezes.  Abdominal: Soft. Bowel sounds are normal. She exhibits no distension and no mass. There is no tenderness. There is no rebound and no guarding.  Musculoskeletal: Normal range of motion. She exhibits no edema and no tenderness.  Neurological: She is alert and oriented to  person, place, and time.  Skin: Skin is warm and dry. No rash noted. She is not diaphoretic. No erythema. No pallor.  Psychiatric: She has a normal mood and affect. Her behavior is normal. Judgment and thought content normal.    Data Reviewed   Assessment    Morbid obesity with a BMI of 59 and obesity related commodities arthritis, GERD, and pseudotumor cerebri. She remains interested in the vertical sleeve Gastrectomy for obesity. We'll long discussion today regarding the perioperative expectations, surgery, and the risks and benefits of the procedure. The risks of infection, bleeding, pain, scarring, weight regain, too little or too much weight loss, vitamin deficiencies and need for lifelong vitamin supplementation, hair loss, need for protein supplementation, leaks, stricture, reflux, food intolerance, need for reoperation and conversion to roux Y gastric bypass, need for open surgery, injury to spleen or surrounding structures, DVT's, PE, and death again discussed with the patient and the patient expressed understanding. I did discuss with her the possibility of worsening reflux and the need for conversion to Roux-en-Y gastric bypass. She really is not interested in gastric bypass unless required for complications or severe reflux. She did express understanding of the possibility of a worsening reflux and the possibility of inability to treat this with medication. This is certainly a concern for both of us but again, she is not interested in the other procedures in feels the need to lose the weight surgically and thus is remaining interested in a sleeve gastrectomy. She did expressed willingness to proceed with Roux-en-Y gastric bypass in the future if needed for complications.     Plan    She wishes to proceed with her planned procedure.  It should does understand the increased risk for possible worsening reflux and the need for conversion to gastric bypass. She's going to continue on her  preoperative diet and I. Explained that we can change her procedure at any time if desired or if she needs more time to think about this and would be happy to reschedule as well. Otherwise we will proceed with sleeve gastrectomy as scheduled.       Alonzo Loving DAVID 08/20/2012, 10:16 AM    

## 2012-08-20 NOTE — Telephone Encounter (Signed)
Called patient to make her aware to start liquids only 24 hours prior to Laparoscopic sleeve gastrectomy on 08/31/2012.  Patient understood and aware.

## 2012-08-23 ENCOUNTER — Encounter (HOSPITAL_COMMUNITY): Payer: Self-pay | Admitting: Pharmacy Technician

## 2012-08-24 ENCOUNTER — Other Ambulatory Visit (HOSPITAL_COMMUNITY): Payer: Self-pay | Admitting: General Surgery

## 2012-08-24 NOTE — Progress Notes (Addendum)
EKG  1/14 EPIC, office visit note 04/11/11 Dr. [name] on chart

## 2012-08-24 NOTE — Patient Instructions (Addendum)
20 Katherine Baldwin  08/24/2012   Your procedure is scheduled on:  08/31/12  TUESDAY  Report to [facility] at    0515   AM.  Call this number if you have problems the morning of surgery: [phone]       Remember:   Do not eat food  Or drink :After Midnight. Monday NIGHT   Take these medicines the morning of surgery with A SIP OF WATER: hydrocodone if needed   Contacts, dentures or partial plates can not be worn to surgery  Leave suitcase in the car. After surgery it may be brought to your room.  For patients admitted to the hospital, checkout time is 11:00 AM day of  discharge.             SPECIAL INSTRUCTIONS- SEE [facility] PREPARING FOR SURGERY INSTRUCTION SHEET-     DO NOT WEAR JEWELRY, LOTIONS, POWDERS, OR PERFUMES.  WOMEN-- DO NOT SHAVE LEGS OR UNDERARMS FOR 12 HOURS BEFORE SHOWERS.                                                                  Please read over the following fact sheets that you were given: MRSA Information                                                                                  Aine Strycharz  PST [phone]  [phone]                 FAILURE TO FOLLOW THESE INSTRUCTIONS MAY RESULT IN  CANCELLATION   OF YOUR SURGERY                                                  Patient Signature _____________________________

## 2012-08-25 ENCOUNTER — Encounter (HOSPITAL_COMMUNITY): Payer: Self-pay

## 2012-08-25 ENCOUNTER — Encounter (HOSPITAL_COMMUNITY)
Admission: RE | Admit: 2012-08-25 | Discharge: 2012-08-25 | Disposition: A | Discharge status: Home / Self Care | Payer: BC Managed Care – PPO | Source: Ambulatory Visit | Attending: General Surgery | Admitting: General Surgery

## 2012-08-25 DIAGNOSIS — Z01812 Encounter for preprocedural laboratory examination: Secondary | ICD-10-CM | POA: Insufficient documentation

## 2012-08-25 HISTORY — DX: Bronchitis, not specified as acute or chronic: J40

## 2012-08-25 HISTORY — DX: Unspecified osteoarthritis, unspecified site: M19.90

## 2012-08-25 HISTORY — DX: Allergy status to unspecified drugs, medicaments and biological substances: Z88.9

## 2012-08-25 HISTORY — DX: Granuloma of unspecified orbit: H05.119

## 2012-08-25 HISTORY — DX: Gastro-esophageal reflux disease without esophagitis: K21.9

## 2012-08-25 HISTORY — DX: Family history of other specified conditions: Z84.89

## 2012-08-25 LAB — COMPREHENSIVE METABOLIC PANEL
ALT: 23 U/L (ref 0–35)
AST: 23 U/L (ref 0–37)
CO2: 25 mEq/L (ref 19–32)
Calcium: 9.6 mg/dL (ref 8.4–10.5)
Chloride: 102 mEq/L (ref 96–112)
GFR calc non Af Amer: >90 mL/min (ref >90)
Sodium: 137 mEq/L (ref 135–145)
Total Bilirubin: 0.3 mg/dL (ref 0.3–1.2)

## 2012-08-25 LAB — CBC WITH DIFFERENTIAL/PLATELET
Eosinophils Relative: 1 % (ref 0–5)
Lymphocytes Relative: 36 % (ref 12–46)
Monocytes Relative: 6 % (ref 3–12)
Neutrophils Relative %: 57 % (ref 43–77)
Platelets: ADEQUATE 10*3/uL (ref 150–400)
RBC: 4.91 MIL/uL (ref 3.87–5.11)
WBC: 7.4 10*3/uL (ref 4.0–10.5)

## 2012-08-25 LAB — HCG, SERUM, QUALITATIVE: Preg, Serum: NEGATIVE

## 2012-08-30 NOTE — Progress Notes (Signed)
Surgery time change from 0715 to 1415. Pt called and agreed to be at short stay by 1145 on 08/31/12.

## 2012-08-31 ENCOUNTER — Encounter (HOSPITAL_COMMUNITY)
Admission: RE | Disposition: A | Discharge status: Home / Self Care | Payer: Self-pay | Source: Ambulatory Visit | Attending: General Surgery

## 2012-08-31 ENCOUNTER — Encounter (HOSPITAL_COMMUNITY): Payer: Self-pay | Admitting: *Deleted

## 2012-08-31 ENCOUNTER — Inpatient Hospital Stay (HOSPITAL_COMMUNITY)
Admission: RE | Admit: 2012-08-31 | Discharge: 2012-09-02 | DRG: 293 | Disposition: A | Discharge status: Home / Self Care | Payer: BC Managed Care – PPO | Source: Ambulatory Visit | Attending: General Surgery | Admitting: General Surgery

## 2012-08-31 ENCOUNTER — Inpatient Hospital Stay (HOSPITAL_COMMUNITY): Payer: BC Managed Care – PPO | Admitting: Anesthesiology

## 2012-08-31 ENCOUNTER — Encounter (HOSPITAL_COMMUNITY): Payer: Self-pay | Admitting: Anesthesiology

## 2012-08-31 DIAGNOSIS — K2100 Gastro-esophageal reflux disease with esophagitis, without bleeding: Secondary | ICD-10-CM

## 2012-08-31 DIAGNOSIS — Z6841 Body Mass Index (BMI) 40.0 and over, adult: Secondary | ICD-10-CM

## 2012-08-31 DIAGNOSIS — Z87891 Personal history of nicotine dependence: Secondary | ICD-10-CM

## 2012-08-31 DIAGNOSIS — M129 Arthropathy, unspecified: Secondary | ICD-10-CM

## 2012-08-31 DIAGNOSIS — I10 Essential (primary) hypertension: Secondary | ICD-10-CM | POA: Diagnosis present

## 2012-08-31 DIAGNOSIS — K449 Diaphragmatic hernia without obstruction or gangrene: Secondary | ICD-10-CM | POA: Diagnosis present

## 2012-08-31 DIAGNOSIS — K219 Gastro-esophageal reflux disease without esophagitis: Secondary | ICD-10-CM | POA: Diagnosis present

## 2012-08-31 HISTORY — PX: LAPAROSCOPIC GASTRIC SLEEVE RESECTION: SHX5895

## 2012-08-31 SURGERY — GASTRECTOMY, SLEEVE, LAPAROSCOPIC
Anesthesia: General | Site: Abdomen | Wound class: Clean Contaminated

## 2012-08-31 MED ORDER — MORPHINE SULFATE 2 MG/ML IJ SOLN
2.0000 mg | INTRAMUSCULAR | Status: DC | PRN
  Administered 2012-08-31 – 2012-09-01 (×3): 2 mg via INTRAVENOUS
  Administered 2012-09-01 (×3): 4 mg via INTRAVENOUS
  Filled 2012-08-31 (×3): qty 1
  Filled 2012-08-31: qty 2
  Filled 2012-08-31: qty 1
  Filled 2012-08-31: qty 2
  Filled 2012-08-31: qty 1

## 2012-08-31 MED ORDER — UNJURY VANILLA POWDER: 2.0000 [oz_av] | Freq: Four times a day (QID) | ORAL | Status: DC

## 2012-08-31 MED ORDER — MEPERIDINE HCL 50 MG/ML IJ SOLN: 6.2500 mg | INTRAMUSCULAR | Status: DC | PRN

## 2012-08-31 MED ORDER — OXYCODONE-ACETAMINOPHEN 5-325 MG/5ML PO SOLN
5.0000 mL | ORAL | Status: DC | PRN
  Administered 2012-09-01: 5 mL via ORAL
  Administered 2012-09-01 – 2012-09-02 (×3): 10 mL via ORAL
  Filled 2012-08-31: qty 10
  Filled 2012-08-31: qty 5
  Filled 2012-08-31 (×2): qty 10

## 2012-08-31 MED ORDER — ONDANSETRON HCL 4 MG/2ML IJ SOLN: 4.0000 mg | INTRAMUSCULAR | Status: DC | PRN

## 2012-08-31 MED ORDER — PROMETHAZINE HCL 25 MG/ML IJ SOLN
6.2500 mg | INTRAMUSCULAR | Status: DC | PRN
  Administered 2012-08-31: 12.5 mg via INTRAVENOUS

## 2012-08-31 MED ORDER — LIDOCAINE-EPINEPHRINE 1 %-1:100000 IJ SOLN
INTRAMUSCULAR | Status: DC | PRN
  Administered 2012-08-31: 30 mL

## 2012-08-31 MED ORDER — ENOXAPARIN SODIUM 40 MG/0.4ML ~~LOC~~ SOLN
40.0000 mg | Freq: Two times a day (BID) | SUBCUTANEOUS | Status: DC
  Administered 2012-09-01 (×2): 40 mg via SUBCUTANEOUS
  Filled 2012-08-31 (×5): qty 0.4

## 2012-08-31 MED ORDER — PANTOPRAZOLE SODIUM 40 MG IV SOLR
40.0000 mg | INTRAVENOUS | Status: DC
  Administered 2012-08-31 – 2012-09-01 (×2): 40 mg via INTRAVENOUS
  Filled 2012-08-31 (×3): qty 40

## 2012-08-31 MED ORDER — LIDOCAINE HCL (CARDIAC) 20 MG/ML IV SOLN
INTRAVENOUS | Status: DC | PRN
  Administered 2012-08-31: 80 mg via INTRAVENOUS

## 2012-08-31 MED ORDER — GLYCOPYRROLATE 0.2 MG/ML IJ SOLN
INTRAMUSCULAR | Status: DC | PRN
  Administered 2012-08-31: 0.4 mg via INTRAVENOUS

## 2012-08-31 MED ORDER — ROCURONIUM BROMIDE 100 MG/10ML IV SOLN
INTRAVENOUS | Status: DC | PRN
  Administered 2012-08-31: 30 mg via INTRAVENOUS
  Administered 2012-08-31: 50 mg via INTRAVENOUS
  Administered 2012-08-31: 20 mg via INTRAVENOUS

## 2012-08-31 MED ORDER — PROPOFOL INFUSION 10 MG/ML OPTIME: INTRAVENOUS | Status: DC | PRN

## 2012-08-31 MED ORDER — NEOSTIGMINE METHYLSULFATE 1 MG/ML IJ SOLN
INTRAMUSCULAR | Status: DC | PRN
  Administered 2012-08-31: 4 mg via INTRAVENOUS

## 2012-08-31 MED ORDER — UNJURY CHICKEN SOUP POWDER: 2.0000 [oz_av] | Freq: Four times a day (QID) | ORAL | Status: DC

## 2012-08-31 MED ORDER — 0.9 % SODIUM CHLORIDE (POUR BTL) OPTIME
TOPICAL | Status: DC | PRN
  Administered 2012-08-31: 1000 mL

## 2012-08-31 MED ORDER — UNJURY CHOCOLATE CLASSIC POWDER
2.0000 [oz_av] | Freq: Four times a day (QID) | ORAL | Status: DC
  Administered 2012-09-02: 2 [oz_av] via ORAL

## 2012-08-31 MED ORDER — ONDANSETRON HCL 4 MG/2ML IJ SOLN
INTRAMUSCULAR | Status: DC | PRN
  Administered 2012-08-31: 4 mg via INTRAVENOUS

## 2012-08-31 MED ORDER — BUPIVACAINE HCL 0.25 % IJ SOLN
INTRAMUSCULAR | Status: DC | PRN
  Administered 2012-08-31: 30 mL

## 2012-08-31 MED ORDER — ACETAMINOPHEN 160 MG/5ML PO SOLN: 650.0000 mg | ORAL | Status: DC | PRN

## 2012-08-31 MED ORDER — LACTATED RINGERS IV SOLN: INTRAVENOUS | Status: DC | PRN

## 2012-08-31 MED ORDER — ACETAMINOPHEN 10 MG/ML IV SOLN: 1000.0000 mg | Freq: Once | INTRAVENOUS | Status: DC | PRN

## 2012-08-31 MED ORDER — HEPARIN SODIUM (PORCINE) 5000 UNIT/ML IJ SOLN
5000.0000 [IU] | Freq: Once | INTRAMUSCULAR | Status: AC
  Administered 2012-08-31: 5000 [IU] via SUBCUTANEOUS
  Filled 2012-08-31: qty 1

## 2012-08-31 MED ORDER — OXYCODONE HCL 5 MG PO TABS
5.0000 mg | ORAL_TABLET | Freq: Once | ORAL | Status: DC | PRN
Start: 2012-08-31 — End: 2012-08-31

## 2012-08-31 MED ORDER — TISSEEL VH 10 ML EX KIT
PACK | CUTANEOUS | Status: DC | PRN
  Administered 2012-08-31: 1

## 2012-08-31 MED ORDER — SODIUM CHLORIDE 0.9 % IV SOLN
1.0000 g | Freq: Once | INTRAVENOUS | Status: AC
  Administered 2012-08-31: 1 g via INTRAVENOUS

## 2012-08-31 MED ORDER — LACTATED RINGERS IV SOLN
INTRAVENOUS | Status: DC
  Administered 2012-08-31: 1000 mL via INTRAVENOUS
  Administered 2012-08-31 (×2): via INTRAVENOUS

## 2012-08-31 MED ORDER — FENTANYL CITRATE 0.05 MG/ML IJ SOLN
INTRAMUSCULAR | Status: DC | PRN
  Administered 2012-08-31 (×5): 50 ug via INTRAVENOUS

## 2012-08-31 MED ORDER — KCL IN DEXTROSE-NACL 20-5-0.45 MEQ/L-%-% IV SOLN
INTRAVENOUS | Status: DC
  Administered 2012-08-31 – 2012-09-02 (×5): via INTRAVENOUS
  Filled 2012-08-31 (×7): qty 1000

## 2012-08-31 MED ORDER — PHENYLEPHRINE HCL 10 MG/ML IJ SOLN
INTRAMUSCULAR | Status: DC | PRN
  Administered 2012-08-31: 80 ug via INTRAVENOUS
  Administered 2012-08-31: 40 ug via INTRAVENOUS

## 2012-08-31 MED ORDER — PROPOFOL 10 MG/ML IV BOLUS
INTRAVENOUS | Status: DC | PRN
  Administered 2012-08-31: 200 mg via INTRAVENOUS
  Administered 2012-08-31: 50 mg via INTRAVENOUS

## 2012-08-31 MED ORDER — MIDAZOLAM HCL 5 MG/5ML IJ SOLN
INTRAMUSCULAR | Status: DC | PRN
  Administered 2012-08-31: 2 mg via INTRAVENOUS

## 2012-08-31 MED ORDER — LACTATED RINGERS IR SOLN
Status: DC | PRN
  Administered 2012-08-31: 1000 mL

## 2012-08-31 MED ORDER — HYDROMORPHONE HCL PF 1 MG/ML IJ SOLN
0.2500 mg | INTRAMUSCULAR | Status: DC | PRN
  Administered 2012-08-31 (×2): 0.5 mg via INTRAVENOUS

## 2012-08-31 MED ORDER — OXYCODONE HCL 5 MG/5ML PO SOLN
5.0000 mg | Freq: Once | ORAL | Status: DC | PRN
  Filled 2012-08-31: qty 5

## 2012-08-31 SURGICAL SUPPLY — 50 items
APPLICATOR COTTON TIP 6IN STRL (MISCELLANEOUS) IMPLANT
APPLIER CLIP ROT 10 11.4 M/L (STAPLE) ×3
CABLE HIGH FREQUENCY MONO STRZ (ELECTRODE) ×3 IMPLANT
CANISTER SUCTION 2500CC (MISCELLANEOUS) ×6 IMPLANT
CHLORAPREP W/TINT 26ML (MISCELLANEOUS) ×6 IMPLANT
CLIP APPLIE ROT 10 11.4 M/L (STAPLE) ×2 IMPLANT
CLOTH BEACON ORANGE TIMEOUT ST (SAFETY) ×3 IMPLANT
DERMABOND ADVANCED (GAUZE/BANDAGES/DRESSINGS) ×2
DERMABOND ADVANCED .7 DNX12 (GAUZE/BANDAGES/DRESSINGS) ×4 IMPLANT
DEVICE SUTURE ENDOST 10MM (ENDOMECHANICALS) ×3 IMPLANT
DRAIN CHANNEL 19F RND (DRAIN) ×3 IMPLANT
DRAPE LAPAROSCOPIC ABDOMINAL (DRAPES) ×3 IMPLANT
DRAPE UTILITY 15X26 (DRAPE) ×6 IMPLANT
ELECT REM PT RETURN 9FT ADLT (ELECTROSURGICAL) ×3
ELECTRODE REM PT RTRN 9FT ADLT (ELECTROSURGICAL) ×2 IMPLANT
EVACUATOR DRAINAGE 10X20 100CC (DRAIN) ×2 IMPLANT
EVACUATOR SILICONE 100CC (DRAIN) ×1 IMPLANT
GLOVE SURG SS PI 7.5 STRL IVOR (GLOVE) ×6 IMPLANT
GOWN STRL NON-REIN LRG LVL3 (GOWN DISPOSABLE) ×3 IMPLANT
GOWN STRL REIN XL XLG (GOWN DISPOSABLE) ×12 IMPLANT
HANDLE STAPLE EGIA 4 XL (STAPLE) ×3 IMPLANT
HOVERMATT SINGLE USE (MISCELLANEOUS) ×3 IMPLANT
KIT BASIN OR (CUSTOM PROCEDURE TRAY) ×3 IMPLANT
MARKER SKIN DUAL TIP RULER LAB (MISCELLANEOUS) IMPLANT
NEEDLE SPNL 22GX3.5 QUINCKE BK (NEEDLE) ×3 IMPLANT
NS IRRIG 1000ML POUR BTL (IV SOLUTION) ×3 IMPLANT
PENCIL BUTTON HOLSTER BLD 10FT (ELECTRODE) ×3 IMPLANT
POUCH SPECIMEN RETRIEVAL 10MM (ENDOMECHANICALS) IMPLANT
RELOAD EGIA 60 MED/THCK PURPLE (STAPLE) ×9 IMPLANT
RELOAD TRI 2.0 60 XTHK VAS SUL (STAPLE) ×6 IMPLANT
SCISSORS LAP 5X35 DISP (ENDOMECHANICALS) IMPLANT
SEALANT SURGICAL APPL DUAL CAN (MISCELLANEOUS) ×3 IMPLANT
SET IRRIG TUBING LAPAROSCOPIC (IRRIGATION / IRRIGATOR) ×3 IMPLANT
SHEARS CURVED HARMONIC AC 45CM (MISCELLANEOUS) ×3 IMPLANT
SLEEVE ENDOPATH XCEL 5M (ENDOMECHANICALS) ×9 IMPLANT
SOLUTION ANTI FOG 6CC (MISCELLANEOUS) ×3 IMPLANT
SPONGE GAUZE 4X4 12PLY (GAUZE/BANDAGES/DRESSINGS) ×3 IMPLANT
SPONGE LAP 18X18 X RAY DECT (DISPOSABLE) ×3 IMPLANT
SUT ETHILON 2 0 PS N (SUTURE) ×3 IMPLANT
SUT MNCRL AB 4-0 PS2 18 (SUTURE) ×6 IMPLANT
SUT VICRYL 0 UR6 27IN ABS (SUTURE) ×3 IMPLANT
SYR 50ML LL SCALE MARK (SYRINGE) ×3 IMPLANT
TAPE CLOTH SURG 4X10 WHT LF (GAUZE/BANDAGES/DRESSINGS) ×3 IMPLANT
TRAY FOLEY CATH 14FRSI W/METER (CATHETERS) ×3 IMPLANT
TRAY LAP CHOLE (CUSTOM PROCEDURE TRAY) ×3 IMPLANT
TROCAR BLADELESS 15MM (ENDOMECHANICALS) ×3 IMPLANT
TROCAR BLADELESS OPT 5 100 (ENDOMECHANICALS) ×3 IMPLANT
TUBING CONNECTING 10 (TUBING) ×3 IMPLANT
TUBING ENDO SMARTCAP (MISCELLANEOUS) ×3 IMPLANT
TUBING FILTER THERMOFLATOR (ELECTROSURGICAL) ×3 IMPLANT

## 2012-08-31 NOTE — Anesthesia Preprocedure Evaluation (Addendum)
Anesthesia Evaluation  Patient identified by MRN, date of birth, ID band Patient awake    Reviewed: Allergy & Precautions, H&P , NPO status , Patient's Chart, lab work & pertinent test results  Airway Mallampati: II TM Distance: >3 FB Neck ROM: Full    Dental  (+) Dental Advisory Given and Teeth Intact   Pulmonary neg pulmonary ROS,  breath sounds clear to auscultation        Cardiovascular negative cardio ROS  Rhythm:Regular Rate:Normal     Neuro/Psych  Headaches, negative psych ROS   GI/Hepatic Neg liver ROS, GERD-  Medicated,  Endo/Other  Morbid obesity  Renal/GU negative Renal ROS     Musculoskeletal negative musculoskeletal ROS (+)   Abdominal (+) + obese,   Peds  Hematology negative hematology ROS (+)   Anesthesia Other Findings   Reproductive/Obstetrics negative OB ROS                          Anesthesia Physical Anesthesia Plan  ASA: III  Anesthesia Plan: General   Post-op Pain Management:    Induction: Intravenous  Airway Management Planned: Oral ETT  Additional Equipment:   Intra-op Plan:   Post-operative Plan: Extubation in OR  Informed Consent: I have reviewed the patients History and Physical, chart, labs and discussed the procedure including the risks, benefits and alternatives for the proposed anesthesia with the patient or authorized representative who has indicated his/her understanding and acceptance.   Dental advisory given  Plan Discussed with: CRNA  Anesthesia Plan Comments:         Anesthesia Quick Evaluation

## 2012-08-31 NOTE — Interval H&P Note (Signed)
History and Physical Interval Note:  08/31/2012 1:05 PM  Katherine Baldwin  has presented today for surgery, with the diagnosis of morbid obesity  The various methods of treatment have been discussed with the patient and family. After consideration of risks, benefits and other options for treatment, the patient has consented to  Procedure(s) with comments: LAPAROSCOPIC GASTRIC SLEEVE RESECTION (N/A) - Laparoscopic Sleeve Gastrectomy with EGD ESOPHAGOGASTRODUODENOSCOPY (EGD) (N/A) as a surgical intervention .  The patient's history has been reviewed, patient examined, no change in status, stable for surgery.  I have reviewed the patient's chart and labs.  Questions were answered to the patient's satisfaction.  I again reviewed with her the risks of the procedure.  The risks of infection, bleeding, pain, scarring, weight regain, too little or too much weight loss, vitamin deficiencies and need for lifelong vitamin supplementation, hair loss, need for protein supplementation, leaks, stricture, reflux, food intolerance, need for reoperation and conversion to roux Y gastric bypass, need for open surgery, injury to spleen or surrounding structures, DVT's, PE, and death again discussed with the patient and the patient expressed understanding and desires to proceed with laparoscopic vertical sleeve gastrectomy, possible open, intraoperative endoscopy. We again reviewed the risk for worsening reflux and the need for increased medications or possibly the need for conversion to RYGB for uncontrolled reflux.  She again expressed understanding of this and desires to proceed with sleeve gastrectomy.  I also explained that I will start with the EGD portion and if her esophagus looks irritated or signs of esophagitis, then I would abandon the sleeve portion of the procedure and we would need to reconsider another option.  If the esophagus looks okay, then we will continue with sleeve gastrectomy.  She expressed understanding  of this and agreed to this plan.      Lodema Pilot DAVID

## 2012-08-31 NOTE — Brief Op Note (Signed)
08/31/2012  4:25 PM  PATIENT:  Katherine Baldwin  37 y.o. female  PRE-OPERATIVE DIAGNOSIS:  morbid obesity  POST-OPERATIVE DIAGNOSIS:  morbid obesity  PROCEDURE:  Procedure(s) with comments: LAPAROSCOPIC GASTRIC SLEEVE RESECTION (N/A) - Laparoscopic Sleeve Gastrectomy with EGD  SURGEON:  Surgeon(s) and Role:    * Lodema Pilot, DO - Primary  PHYSICIAN ASSISTANT:   ASSISTANTS: Newman   ANESTHESIA:   general  EBL:  Total I/O In: 2100 [I.V.:2100] Out: 185 [Urine:125; Other:10; Blood:50]  BLOOD ADMINISTERED:none  DRAINS: (65F) Jackson-Pratt drain(s) with closed bulb suction in the sleeve staple line   LOCAL MEDICATIONS USED:  MARCAINE    and LIDOCAINE   SPECIMEN:  Source of Specimen:  greater curve of stomach  DISPOSITION OF SPECIMEN:  PATHOLOGY  COUNTS:  YES  TOURNIQUET:  * No tourniquets in log *  DICTATION: .Other Dictation: Dictation Number dictated  PLAN OF CARE: Admit to inpatient   PATIENT DISPOSITION:  PACU - hemodynamically stable.   Delay start of Pharmacological VTE agent (>24hrs) due to surgical blood loss or risk of bleeding: no

## 2012-08-31 NOTE — H&P (View-Only) (Signed)
Patient ID: Katherine Baldwin, female   DOB: 1975/11/29, 37 y.o.   MRN: [id number]  Chief Complaint  Patient presents with  . Bariatric Pre-op    pre op gastric sleeve    HPI Katherine Baldwin is a 37 y.o. female. This patient comes in for her preoperative surgery evaluation in preparation for sleeve gastrectomy. She has a BMI of 59 with obesity related comorbidities of GERD, arthritis, and pseudotumor cerebri. She has started her preoperative diet and remains interested in sleeve gastrectomy. She has done a lot of research online and remains interested in the sleeve. She did have reflux on her upper GI and some reflux after coming off of her PPIs. HPI  Past Medical History  Diagnosis Date  . Trichomonas   . Headache(784.0)   . Chicken pox   . Abnormal Pap smear 1998    no treatment required  . Morbid obesity with body mass index of 60.0-69.9 in adult     Past Surgical History  Procedure Laterality Date  . Spinal tap      Family History  Problem Relation Age of Onset  . Diabetes Paternal Grandfather   . Diabetes Paternal Grandmother   . Diabetes Maternal Grandmother   . Diabetes Maternal Grandfather   . Cancer Father     prostate, gallbladder, prostate breast, stomach  . Arthritis Father     rhematoid  . Diabetes Father     Prediabetes  . Diabetes Mother   . Asthma Mother   . Hypertension Mother   . Fibroids Sister   . Thyroid disease Sister     Social History History  Substance Use Topics  . Smoking status: Former Smoker -- 0.50 packs/day    Types: Cigarettes    Quit date: 03/10/2010  . Smokeless tobacco: Never Used  . Alcohol Use: No    Allergies  Allergen Reactions  . Fruit & Vegetable Daily (Nutritional Supplements)     Fruits and uncooked vegetables-itchy mouth and throat. Patient reports this is transient and she never knows which foods will have a rxn    Current Outpatient Prescriptions  Medication Sig Dispense Refill  . acetaminophen (TYLENOL) 500  MG tablet Take 500 mg by mouth every 6 (six) hours as needed. pain      . HYDROcodone-acetaminophen (NORCO) 5-325 MG per tablet Take 1 tablet by mouth every 6 (six) hours as needed.      . Multiple Vitamin (MULITIVITAMIN WITH MINERALS) TABS Take 1 tablet by mouth daily.      Marland Kitchen omeprazole (PRILOSEC) 40 MG capsule Take 40 mg by mouth daily.      Marland Kitchen topiramate (TOPAMAX) 25 MG tablet Take 25 mg by mouth 2 (two) times daily.      . diclofenac (VOLTAREN) 75 MG EC tablet Take 75 mg by mouth 2 (two) times daily.      . meloxicam (MOBIC) 7.5 MG tablet        No current facility-administered medications for this visit.    Review of Systems Review of Systems All other review of systems negative or noncontributory except as stated in the HPI  Blood pressure 112/84, pulse 76, temperature 97.6 F (36.4 C), temperature source Temporal, resp. rate 16, height 5\' 6"  (1.676 m), weight 365 lb 3.2 oz (165.654 kg).  Physical Exam Physical Exam Physical Exam  Nursing note and vitals reviewed. Constitutional: She is oriented to person, place, and time. She appears well-developed and well-nourished. No distress.  HENT:  Head: Normocephalic and atraumatic.  Mouth/Throat:  No oropharyngeal exudate.  Eyes: Conjunctivae and EOM are normal. Pupils are equal, round, and reactive to light. Right eye exhibits no discharge. Left eye exhibits no discharge. No scleral icterus.  Neck: Normal range of motion. Neck supple. No tracheal deviation present.  Cardiovascular: Normal rate, regular rhythm, normal heart sounds and intact distal pulses.   Pulmonary/Chest: Effort normal and breath sounds normal. No stridor. No respiratory distress. She has no wheezes.  Abdominal: Soft. Bowel sounds are normal. She exhibits no distension and no mass. There is no tenderness. There is no rebound and no guarding.  Musculoskeletal: Normal range of motion. She exhibits no edema and no tenderness.  Neurological: She is alert and oriented to  person, place, and time.  Skin: Skin is warm and dry. No rash noted. She is not diaphoretic. No erythema. No pallor.  Psychiatric: She has a normal mood and affect. Her behavior is normal. Judgment and thought content normal.    Data Reviewed   Assessment    Morbid obesity with a BMI of 59 and obesity related commodities arthritis, GERD, and pseudotumor cerebri. She remains interested in the vertical sleeve Gastrectomy for obesity. We'll long discussion today regarding the perioperative expectations, surgery, and the risks and benefits of the procedure. The risks of infection, bleeding, pain, scarring, weight regain, too little or too much weight loss, vitamin deficiencies and need for lifelong vitamin supplementation, hair loss, need for protein supplementation, leaks, stricture, reflux, food intolerance, need for reoperation and conversion to roux Y gastric bypass, need for open surgery, injury to spleen or surrounding structures, DVT's, PE, and death again discussed with the patient and the patient expressed understanding. I did discuss with her the possibility of worsening reflux and the need for conversion to Roux-en-Y gastric bypass. She really is not interested in gastric bypass unless required for complications or severe reflux. She did express understanding of the possibility of a worsening reflux and the possibility of inability to treat this with medication. This is certainly a concern for both of [place] but again, she is not interested in the other procedures in feels the need to lose the weight surgically and thus is remaining interested in a sleeve gastrectomy. She did expressed willingness to proceed with Roux-en-Y gastric bypass in the future if needed for complications.     Plan    She wishes to proceed with her planned procedure.  It should does understand the increased risk for possible worsening reflux and the need for conversion to gastric bypass. She's going to continue on her  preoperative diet and I. Explained that we can change her procedure at any time if desired or if she needs more time to think about this and would be happy to reschedule as well. Otherwise we will proceed with sleeve gastrectomy as scheduled.       Lodema Pilot DAVID 08/20/2012, 10:16 AM

## 2012-08-31 NOTE — Transfer of Care (Signed)
Immediate Anesthesia Transfer of Care Note  Patient: Katherine Baldwin  Procedure(s) Performed: Procedure(s) with comments: LAPAROSCOPIC GASTRIC SLEEVE RESECTION (N/A) - Laparoscopic Sleeve Gastrectomy with EGD  Patient Location: PACU  Anesthesia Type:General  Level of Consciousness: awake, sedated and patient cooperative  Airway & Oxygen Therapy: Patient Spontanous Breathing and Patient connected to face mask oxygen  Post-op Assessment: Report given to PACU RN and Post -op Vital signs reviewed and stable  Post vital signs: Reviewed and stable  Complications: No apparent anesthesia complications

## 2012-08-31 NOTE — Op Note (Signed)
Katherine Baldwin, Katherine Baldwin              ACCOUNT NO.:  [id number]  MEDICAL RECORD NO.:  [id number]  LOCATION:  1529                         FACILITY:  [facility]  PHYSICIAN:  Lodema Pilot, MD       DATE OF BIRTH:  [birth year]  DATE OF PROCEDURE:  08/31/2012 DATE OF DISCHARGE:                              OPERATIVE REPORT   PROCEDURE:  Laparoscopic vertical sleeve gastrectomy and esophagogastroduodenoscopy.  PREOPERATIVE DIAGNOSES:  Obesity and reflux.  POSTOPERATIVE DIAGNOSES:  Obesity and reflux.  SURGEON:  Lodema Pilot, MD  ASSISTANT:  Dr. __________.  ANESTHESIA:  General endotracheal anesthesia with 30 mL of 1% lidocaine with epinephrine and 0.25% Marcaine in a 50:50 mixture.  FLUIDS:  2 L of crystalloid.  ESTIMATED BLOOD LOSS:  Minimal.  DRAINS:  A 19-French Blake drain placed along the few staple lines.  SPECIMENS:  Greater curvature of the stomach sent to Pathology for permanent sectioning.  COMPLICATIONS:  None apparent.  FINDINGS:  Esophagus normal.  No evidence of any esophagitis or Barrett's esophagus.  Sleeve gastrectomy performed using 36-French bougie.  Drain placed posterior to the sleeve staple lines.  INDICATION FOR PROCEDURE:  Katherine Baldwin is a 37 year old female with a longstanding history of acid reflux and morbid obesity, who desires terrible weight loss solutions.  Despite my counseling and recommending that she consider other weight loss procedures, she was insistent on sleeve gastrectomy despite my counseling that this might worsen her reflux.  OPERATIVE DETAILS:  Katherine Baldwin was seen and evaluated in the preoperative area and risks and benefits of the procedure were again discussed in lay terms.  Informed consent was obtained.  She was given subcu heparin and prophylactic antibiotics and I explained to her that the plan would be for starting out with EGD to evaluate for any esophagitis or esophageal injury from longstanding reflux and if this was  present then we would abandon the procedure and not perform sleeve gastrectomy.  She was taken to the operating room and placed on the table in supine position and general endotracheal anesthesia obtained. Foley catheter was placed and procedure time-out was performed with all operative team members to confirm proper patient and procedure.  I passed a well-lubricated fiberoptic endoscope into the mouth and esophagus and then passed this down towards the GE junction.  There was no evidence of any esophageal masses or strictures and there was no esophagitis or evidence of any Barrett's esophagus.  The Z-line was normal and located about 38 cm.  It appeared that she might have a small sliding type hiatal hernia initially on her endoscopy, but I advanced the scope into the stomach and duodenum and these appeared normal.  The scope was pulled back in the stomach and retroflexion was performed, and I do not see any gastric lesions and on retroflexion, I did not see any evidence of hiatal hernia as well.  The air was removed from the stomach and the scope was removed and I decided to proceed with sleeve gastrectomy.  Her abdomen was prepped and draped in a standard surgical fashion and we again performed another time-out.  A 5-mm Optiview trocar was used to access the peritoneum in the left upper quadrant  and pneumoperitoneum was obtained.  Laparoscope was introduced.  There was no evidence of bleeding or bowel injury upon entry.  A 5-mm left rectus port was placed, 15-mm right rectus port and a 5-mm right upper quadrant port were placed under direct visualization.  An additional 5-mm port was placed laterally along the left for additional assistance working ports.  A 5-mm stab incision was used to place a Hotel manager through the epigastrium to retract the left lobe of the liver and I again inspected her GE junction and there was no evidence of any dimple or hiatal hernia  anteriorly.  I identified the pylorus and measured out 5 cm from the pylorus and began dividing the short gastric vessels.  I entered the lesser sac and divided the short gastric vessels up along the greater curvature of the stomach.  I mobilized the stomach from the spleen and from the left crus of the diaphragm.  I continued to roll the stomach from a left to right fashion and completely cleaned off the left crus to evaluate for any hiatal hernia posteriorly and we do not see any evidence of hiatal hernia, but because of her upper GI and the initial impression on EGD, I opened up the pars flaccida and identified the right crus.  I divided the phrenoesophageal membrane anteriorly and mobilized, created a window posterior to the esophagus. The GE junction appeared to be well in the abdomen and there did not appear to be any hiatal hernia posteriorly as well.  I decided that there was really no hiatal hernia that needed to be repaired and so I did not place any stitches in the diaphragms, but with the stomach completely mobilized, I again identified the pylorus and measured out 5 cm and began creating the sleeve.  A 60 mm black Tri-Staple load was placed on the antrum and the first firing was taken care to avoid entering the stomach at the angularis.  The second black 60 mm Tri- Staple load was placed in the anticipated direction of firings at the crotch of the prior staple line and after this was what looked to be a good position, we passed a 36-French bougie through the mouth along the lesser curvature of the stomach and into the antrum and towards the duodenum without reclamping the stapler, the second staple firing was taken and I then transitioned to purple 60 mm Tri-Staple load.  I fired sequentially along the bougie creating the sleeve taking care not to approach the bougie too tightly and also to avoid Christmas-tree formation at the staple lines.  With the final firings, I angled  off gastric fat pad and made sure that I could see that we were stapling on the stomach and not incorporating esophagus into the apex of the staple lines.  The stomach was completely transected and hemostasis was obtained along the staple lines with several hemoclips in the areas of oozing.  Again inspected for any hiatal hernia and did not see anything that needed suturing or repair.  The sleeve appeared very smooth without any filing or stricturing and the staple line was noted to be hemostatic.  Dr. Ezzard Standing at this time repeated upper endoscopy and passed the scope through the esophagus and into the sleeve.  The sleeve actually appeared good, tubular, and without any evidence of narrowing or stricturing internally and there was no evidence of any internal bleeding along the staple lines.  We inflated the sleeve with air while I had to submerge it under  water and there was also no evidence of any air leakage.  The scope was removed and Tisseel fibrin glue was placed along the sleeve staple line and this was again noted to be hemostatic. I placed a 19-French Blake drain just posterior to the staple line and exited through one of the left upper quadrant trocar sites and this was sutured in place with a nylon drain stitch.  The Nathanson retractor was removed and because her subcu tissue was so deep, we decided to close the 15-mm port site using Endoclose device.  The fascia was initially and skin incision was initially enlarged to remove the resected stomach and this was removed and sent to Pathology for permanent sectioning. Then, I using Endoclose device and 0 Vicryl and figure-of-eight suture, I approximated the fascia and another single suture was placed through the anterior fascia in an open fashion to approximate the wound more closely.  Sutures were secured and the abdomen was re-insufflated with carbon dioxide gas.  The abdominal wall closure was noted to be adequate without any  evidence of bowel injury.  The remainder of the trocars were removed under direct visualization.  Abdominal wall was noted be hemostatic.  The left upper quadrant appeared hemostatic.  The trocars were removed and the wounds were injected with 30 mL of 1% lidocaine with epinephrine and 0.25% Marcaine in a 50:50 mixture.  The skin edges were approximated with 4-0 Monocryl subcuticular suture.  Skin was washed and dried and Dermabond was applied.  All sponge, needle, and instrument counts were correct at the end of the case.  The patient tolerated the procedure well without apparent complications.          ______________________________ Lodema Pilot, MD     BL/MEDQ  D:  08/31/2012  T:  08/31/2012  Job:  [id number]

## 2012-08-31 NOTE — Anesthesia Postprocedure Evaluation (Signed)
Anesthesia Post Note  Patient: Katherine Baldwin  Procedure(s) Performed: Procedure(s) (LRB): LAPAROSCOPIC GASTRIC SLEEVE RESECTION (N/A)  Anesthesia type: General  Patient location: PACU  Post pain: Pain level controlled  Post assessment: Post-op Vital signs reviewed  Last Vitals: BP 133/63  Pulse 65  Temp(Src) 36.9 C (Oral)  Resp 24  Ht 5\' 6"  (1.676 m)  Wt 358 lb 12.8 oz (162.751 kg)  BMI 57.94 kg/m2  SpO2 100%  LMP 08/13/2012  Post vital signs: Reviewed  Level of consciousness: sedated  Complications: No apparent anesthesia complications

## 2012-08-31 NOTE — Op Note (Signed)
NAMESHERISSE, Katherine Baldwin              ACCOUNT NO.:  [id number]  MEDICAL RECORD NO.:  [id number]  LOCATION:  1529                         FACILITY:  [facility]  PHYSICIAN:  Sandria Bales. Ezzard Standing, M.D.  DATE OF BIRTH:  06/10/1975  DATE OF PROCEDURE:  08/31/2012                              OPERATIVE REPORT   PREOPERATIVE DIAGNOSIS:  Morbid obesity, status post laparoscopic sleeve gastrectomy.  POSTOPERATIVE DIAGNOSES:  Morbid obesity, status post laparoscopic sleeve gastrectomy.  PROCEDURE:  Esophagogastroscopy.  SURGEON:  Sandria Bales. Ezzard Standing, M.D.  FIRST ASSISTANT:  None.  ANESTHESIA:  General endotracheal.  ESTIMATED BLOOD LOSS:  Minimal.  INDICATION FOR PROCEDURE:  Ms. Lewellyn is a 37 year old African American female who has undergone a laparoscopic sleeve gastrectomy by Dr. Lodema Pilot for morbid obesity.  I am doing an upper endoscopy to check the sleeve mucosa and check for any leaks.  OPERATIVE NOTE:  The patient was in a supine position.  The patient was intubated,  Dr. Biagio Quint was manning the laparoscope while I did the upper endoscopy.    I passed the Olympus endoscope down the back of her throat without difficulty.  The GE junction was about 38 cm.  About 2 cm to 3 cm beyond the GE junction I identified the staple line, which was intact.  There was no bleeding from the staple line.  The mucosa looked viable.  I was able to advance the scope down the gastric tube without narrowing.  However, there was some angulation at the incisura, but I advanced the scope through to the antrum.  I identified the pylorus and the duodenum beyond it.  There was no bleeding and no mucosal injury.  While I insufflated the gastric sleeve, Dr. Biagio Quint flooded the upper abdomen with saline, saw no bubbling or evidence of a leak.  The patient tolerated the procedure well.  I then withdrew the scope without difficulty.  Dr. Biagio Quint will dictate the primary sleeve gastrectomy portion of the operation.  Sandria Bales. Ezzard Standing,  M.D., FACS   DHN/MEDQ  D:  08/31/2012  T:  08/31/2012  Job:  [id number]  cc:   Lodema Pilot, MD [address], [address] [place], [place] [address]

## 2012-09-01 ENCOUNTER — Encounter (HOSPITAL_COMMUNITY): Payer: Self-pay | Admitting: General Surgery

## 2012-09-01 LAB — COMPREHENSIVE METABOLIC PANEL
AST: 27 U/L (ref 0–37)
Albumin: 3.4 g/dL — ABNORMAL LOW (ref 3.5–5.2)
Alkaline Phosphatase: 64 U/L (ref 39–117)
BUN: 6 mg/dL (ref 6–23)
Chloride: 102 mEq/L (ref 96–112)
Potassium: 4 mEq/L (ref 3.5–5.1)
Total Bilirubin: 0.3 mg/dL (ref 0.3–1.2)
Total Protein: 7.1 g/dL (ref 6.0–8.3)

## 2012-09-01 LAB — CBC WITH DIFFERENTIAL/PLATELET
Basophils Absolute: 0 10*3/uL (ref 0.0–0.1)
Basophils Relative: 0 % (ref 0–1)
Eosinophils Absolute: 0 10*3/uL (ref 0.0–0.7)
MCH: 25.8 pg — ABNORMAL LOW (ref 26.0–34.0)
MCHC: 31.7 g/dL (ref 30.0–36.0)
Monocytes Relative: 6 % (ref 3–12)
Neutro Abs: 9 10*3/uL — ABNORMAL HIGH (ref 1.7–7.7)
Neutrophils Relative %: 81 % — ABNORMAL HIGH (ref 43–77)
Platelets: 264 10*3/uL (ref 150–400)
RDW: 14.1 % (ref 11.5–15.5)

## 2012-09-01 MED ORDER — KETOROLAC TROMETHAMINE 30 MG/ML IJ SOLN
30.0000 mg | Freq: Four times a day (QID) | INTRAMUSCULAR | Status: DC | PRN
  Administered 2012-09-01: 30 mg via INTRAVENOUS
  Filled 2012-09-01: qty 1

## 2012-09-01 NOTE — Progress Notes (Signed)
1 Day Post-Op  Subjective: Pain better this am compared to last night.  Some nausea last night but improved this am  Objective: Vital signs in last 24 hours: Temp:  [97.4 F (36.3 C)-98.6 F (37 C)] 97.9 F (36.6 C) (06/25 0550) Pulse Rate:  [59-93] 79 (06/25 0550) Resp:  [16-24] 20 (06/25 0550) BP: (120-160)/(63-86) 125/82 mmHg (06/25 0550) SpO2:  [99 %-100 %] 100 % (06/25 0550) Weight:  [358 lb (162.388 kg)-358 lb 12.8 oz (162.751 kg)] 358 lb (162.388 kg) (06/24 1809) Last BM Date: 08/30/12  Intake/Output from previous day: 06/24 0701 - 06/25 0700 In: 3355 [P.O.:30; I.V.:3325] Out: 2310 [Urine:2125; Drains:125; Blood:50] Intake/Output this shift: Total I/O In: -  Out: 500 [Urine:500]  General appearance: alert, cooperative and no distress Resp: nonlabored Cardio: normal rate GI: soft, appropriate incisional tenderness, ND, no peritoneal signs, JP ss  Lab Results:   Recent Labs  09/01/12 0420  WBC 11.2*  HGB 12.0  HCT 37.9  PLT 264   BMET  Recent Labs  09/01/12 0420  NA 135  K 4.0  CL 102  CO2 27  GLUCOSE 152*  BUN 6  CREATININE 0.65  CALCIUM 9.1   PT/INR No results found for this basename: LABPROT, INR,  in the last 72 hours ABG No results found for this basename: PHART, PCO2, PO2, HCO3,  in the last 72 hours  Studies/Results: No results found.  Anti-infectives: Anti-infectives   Start     Dose/Rate Route Frequency Ordered Stop   08/31/12 1315  ertapenem (INVANZ) 1 g in sodium chloride 0.9 % 50 mL IVPB     1 g 100 mL/hr over 30 Minutes Intravenous  Once 08/31/12 1305 08/31/12 1401      Assessment/Plan: s/p Procedure(s) with comments: LAPAROSCOPIC GASTRIC SLEEVE RESECTION (N/A) - Laparoscopic Sleeve Gastrectomy with EGD Advance diet mobilize.  seems to be doing okay.  LOS: 1 day    Lodema Pilot DAVID 09/01/2012

## 2012-09-01 NOTE — Care Management Note (Signed)
    Page 1 of 1   09/01/2012     11:57:13 AM   CARE MANAGEMENT NOTE 09/01/2012  Patient:  Katherine Baldwin, Katherine Baldwin   Account Number:  [id number]  Date Initiated:  09/01/2012  Documentation initiated by:  Lorenda Ishihara  Subjective/Objective Assessment:   37 yo female admitted s/p sleeve gastrectomy. PTA lived at home with spouse.     Action/Plan:   Home when stable   Anticipated DC Date:  09/03/2012   Anticipated DC Plan:  HOME/SELF CARE      DC Planning Services  CM consult      Choice offered to / List presented to:             Status of service:  Completed, signed off Medicare Important Message given?   (If response is "NO", the following Medicare IM given date fields will be blank) Date Medicare IM given:   Date Additional Medicare IM given:    Discharge Disposition:  HOME/SELF CARE  Per UR Regulation:  Reviewed for med. necessity/level of care/duration of stay  If discussed at Long Length of Stay Meetings, dates discussed:    Comments:

## 2012-09-01 NOTE — Progress Notes (Signed)
Patient ID: Katherine Baldwin, female   DOB: 1975-10-22, 37 y.o.   MRN: [id number] Doing okay.  Having some RUQ pain.  Wounds look fine and no peritoneal signs.  Vitals okay.  Not sure what is causing the right sided pain but she looks overall as though she is doing okay.

## 2012-09-01 NOTE — Progress Notes (Signed)
Patient alert and oriented. Vital Signs stable. Having some "gas" which seems to be moving through abdomen. Encouraged patient to walk to utilize rocking chair to mobilize gas. Will have patient's nurse get chair. Provided copy of bariatric discharge instruction below, of which I'll review later with the patient. GASTRIC BYPASS / SLEEVE  Home Care Instructions  These instructions are to help you care for yourself when you go home.  Call: If you have any problems.   Call [phone] and ask for the surgeon on call   If you need immediate assistance come to the ER at [facility]. Tell the ER staff that you are a new post-op gastric bypass or gastric sleeve patient   Signs and symptoms to report:   Severe vomiting or nausea o If you cannot handle clear liquids for longer than 1 day, call your surgeon    Abdominal pain which does not get better after taking your pain medication   Fever greater than 100.4 F and chills   Heart rate over 100 beats a minute   Trouble breathing   Chest pain    Redness, swelling, drainage, or foul odor at incision (surgical) sites    If your incisions open or pull apart   Swelling or pain in calf (lower leg)   Diarrhea (Loose bowel movements that happen often), frequent watery, uncontrolled bowel movements   Constipation, (no bowel movements for 3 days) if this happens:  o Take Milk of Magnesia, 2 tablespoons by mouth, 3 times a day for 2 days if needed o Stop taking Milk of Magnesia once you have had a bowel movement o Call your doctor if constipation continues Or o Take Miralax  (instead of Milk of Magnesia) following the label instructions o Stop taking Miralax once you have had a bowel movement o Call your doctor if constipation continues   Anything you think is "abnormal for you"   Normal side effects after surgery:   Unable to sleep at night or unable to concentrate   Irritability   Being tearful (crying) or depressed These are common complaints,  possibly related to your anesthesia, stress of surgery and change in lifestyle, that usually go away a few weeks after surgery.  If these feelings continue, call your medical doctor.  Wound Care: You may have surgical glue, steri-strips, or staples over your incisions after surgery   Surgical glue:  Looks like a clear film over your incisions and will wear off a little at a time   Steri-strips : Adhesive strips of tape over your incisions. You may notice a yellowish color on the skin under the steri-strips. This is used to make the   steri-strips stick better. Do not pull the steri-strips off - let them fall off   Staples: Staples may be removed before you leave the hospital o If you go home with staples, call [facility] at for an appointment with your surgeon's nurse to have staples removed 10 days after surgery, [phone]   Showering: You may shower two (2) days after your surgery unless your surgeon tells you differently o Wash gently around incisions with warm soapy water, rinse well, and gently pat dry  o If you have a drain (tube from your incision), you may need someone to hold this while you shower  o No tub baths until staples are removed and incisions are healed     Medications:   Medications should be liquid or crushed if larger than the size of a  dime   Extended release pills (medication that releases a little bit at a time through the day) should not be crushed   Depending on the size and number of medications you take, you may need to space (take a few throughout the day)/change the time you take your medications so that you do not over-fill your pouch (smaller stomach)   Make sure you follow-up with your primary care physician to make medication changes needed during rapid weight loss and life-style changes   If you have diabetes, follow up with the doctor that orders your diabetes medication(s) within one week after surgery and check your blood sugar regularly.   Do  not drive while taking narcotics (pain medications)   Do not take acetaminophen (Tylenol) and Roxicet or Lortab Elixir at the same time since these pain medications contain acetaminophen  Diet:                    First 2 Weeks  You will see the nutritionist about two (2) weeks after your surgery. The nutritionist will increase the types of foods you can eat if you are handling liquids well:   If you have severe vomiting or nausea and cannot handle clear liquids lasting longer than 1 day, call your surgeon  Protein Shake   Drink at least 2 ounces of shake 5-6 times per day   Each serving of protein shakes (usually 8 - 12 ounces) should have a minimum of:  o 15 grams of protein  o And no more than 5 grams of carbohydrate    Goal for protein each day: o Men = 80 grams per day o Women = 60 grams per day   Protein powder may be added to fluids such as non-fat milk or Lactaid milk or Soy milk (limit to 35 grams added protein powder per serving)  Hydration   Slowly increase the amount of water and other clear liquids as tolerated (See Acceptable Fluids)   Slowly increase the amount of protein shake as tolerated     Sip fluids slowly and throughout the day   May use sugar substitutes in small amounts (no more than 6 - 8 packets per day; i.e. Splenda)  Fluid Goal   The first goal is to drink at least 8 ounces of protein shake/drink per day (or as directed by the nutritionist); some examples of protein shakes are [employer], Dillard's, EAS Edge HP, and Unjury. See handout from pre-op Bariatric Education Class: o Slowly increase the amount of protein shake you drink as tolerated o You may find it easier to slowly sip shakes throughout the day o It is important to get your proteins in first   Your fluid goal is to drink 64 - 100 ounces of fluid daily o It may take a few weeks to build up to this   32 oz (or more) should be clear liquids  And    32 oz (or more) should be full liquids (see  below for examples)   Liquids should not contain sugar, caffeine, or carbonation  Clear Liquids:   Water or Sugar-free flavored water (i.e. Fruit H2O, Propel)   Decaffeinated coffee or tea (sugar-free)   Crystal Lite, Wyler's Lite, Minute Maid Lite   Sugar-free Jell-O   Bouillon or broth   Sugar-free Popsicle:   *Less than 20 calories each; Limit 1 per day  Full Liquids: Protein Shakes/Drinks + 2 choices per day of other full liquids   Full liquids  must be: o No More Than 12 grams of Carbs per serving  o No More Than 3 grams of Fat per serving   Strained low-fat cream soup   Non-Fat milk   Fat-free Lactaid Milk   Sugar-free yogurt (Dannon Lite & Fit, Greek yogurt)      Vitamins and Minerals   Start 1 day after surgery unless otherwise directed by your surgeon   2 Chewable Multivitamin / Multimineral Supplement with iron (i.e. Centrum for Adults)   Vitamin B-12, 350 - 500 micrograms sub-lingual (place tablet under the tongue) each day   Chewable Calcium Citrate with Vitamin D-3 (Example: 3 Chewable Calcium Plus 600 with Vitamin D-3) o Take 500 mg three (3) times a day for a total of 1500 mg each day o Do not take all 3 doses of calcium at one time as it may cause constipation, and you can only absorb 500 mg  at a time  o Do not mix multivitamins containing iron with calcium supplements; take 2 hours apart o Do not substitute Tums (calcium carbonate) for your calcium   Menstruating women and those at risk for anemia (a blood disease that causes weakness) may need extra iron o Talk with your doctor to see if you need more iron   If you need extra iron: Total daily Iron recommendation (including Vitamins) is 50 to 100 mg Iron/day   Do not stop taking or change any vitamins or minerals until you talk to your nutritionist or surgeon   Your nutritionist and/or surgeon must approve all vitamin and mineral supplements   Activity and Exercise: It is important to continue walking at home.   Limit your physical activity as instructed by your doctor.  During this time, use these guidelines:   Do not lift anything greater than ten (10) pounds for at least two (2) weeks   Do not go back to work or drive until Designer, industrial/product says you can   You may have sex when you feel comfortable  o It is VERY important for female patients to use a reliable birth control method; fertility often increases after surgery  o Do not get pregnant for at least 18 months   Start exercising as soon as your doctor tells you that you can o Make sure your doctor approves any physical activity   Start with a simple walking program   Walk 5-15 minutes each day, 7 days per week.    Slowly increase until you are walking 30-45 minutes per day Consider joining our BELT program. [phone] or email [email or web address]   Special Instructions Things to remember:   Free counseling is available for you and your family through collaboration between [facility] and [facility] [place]. Please call [phone] and leave a message   Use your CPAP when sleeping if this applies to you   [facility] has a free Bariatric Surgery Support Group that meets monthly, the 3rd Thursday, 6 pm, [facility] Classrooms You can see classes online at [email or web address]   It is very important to keep all follow up appointments with your surgeon, nutritionist, primary care physician, and behavioral health practitioner o After the first year, please follow up with your bariatric surgeon and nutritionist at least once a year in order to maintain best weight loss results [facility]: [phone] [facility]: [phone] Bariatric Nurse Coordinator: [phone]

## 2012-09-02 LAB — CBC WITH DIFFERENTIAL/PLATELET
Basophils Absolute: 0 10*3/uL (ref 0.0–0.1)
HCT: 35.3 % — ABNORMAL LOW (ref 36.0–46.0)
Lymphocytes Relative: 42 % (ref 12–46)
Lymphs Abs: 3.2 10*3/uL (ref 0.7–4.0)
Monocytes Absolute: 0.5 10*3/uL (ref 0.1–1.0)
Neutro Abs: 3.7 10*3/uL (ref 1.7–7.7)
RBC: 4.24 MIL/uL (ref 3.87–5.11)
RDW: 14.3 % (ref 11.5–15.5)
WBC: 7.5 10*3/uL (ref 4.0–10.5)

## 2012-09-02 MED ORDER — OXYCODONE-ACETAMINOPHEN 5-325 MG/5ML PO SOLN: 5.0000 mL | ORAL | Status: DC | PRN

## 2012-09-02 MED ORDER — ONDANSETRON 4 MG PO TBDP: 4.0000 mg | ORAL_TABLET | Freq: Three times a day (TID) | ORAL | Status: DC | PRN

## 2012-09-02 MED ORDER — PANTOPRAZOLE SODIUM 40 MG PO PACK: 40.0000 mg | PACK | Freq: Every day | ORAL | Status: DC

## 2012-09-02 NOTE — Discharge Summary (Signed)
  Physician Discharge Summary  Patient ID: Katherine Baldwin MRN: [id number] DOB/AGE: 37-Jun-1977 37 y.o.  Admit date: 08/31/2012 Discharge date: 09/02/2012  Admission Diagnoses: obesity  Discharge Diagnoses: obesity Active Problems:   * No active hospital problems. *   Discharged Condition: stable  Hospital Course: to OR 08/31/12 for lap sleeve gastrectomy/EGD.  Diet advanced on POD 1.  Pain improved and ambulatory and stable for discharge to home on POD 2.  Consults: None  Significant Diagnostic Studies: none  Treatments: surgery: 08/31/12 lap sleeve gastrectomy/EGD  Disposition: 01-Home or Self Care  Discharge Orders   Future Appointments Provider Department Dept Phone   09/14/2012 4:00 PM [facility] Post-Op Class [facility] [phone]   09/17/2012 2:00 PM Nilda Riggs, NP [facility] [phone]   09/30/2012 1:15 PM Lodema Pilot, DO [facility] [place] [phone]   Future Orders Complete By Expires     Call MD for:  difficulty breathing, headache or visual disturbances  As directed     Call MD for:  persistant dizziness or light-headedness  As directed     Call MD for:  persistant nausea and vomiting  As directed     Call MD for:  redness, tenderness, or signs of infection (pain, swelling, redness, odor or green/yellow discharge around incision site)  As directed     Call MD for:  severe uncontrolled pain  As directed     Call MD for:  temperature >100.4  As directed     Discharge instructions  As directed     Comments:      May shower tomorrow.   Call [phone] for follow up appointment in about 3 weeks. May start full liquid diet tomorrow x1 week, then pureed diet x1 week, then soft diet x1 week, then advance to high protein, low fat, low carb diet as tolerated. Call your primary doctor to schedule follow up appointment for management of your routine medications.    Increase activity slowly  As  directed         Medication List    STOP taking these medications       acetaminophen 500 MG tablet  Commonly known as:  TYLENOL     HYDROcodone-acetaminophen 5-325 MG per tablet  Commonly known as:  NORCO/VICODIN      TAKE these medications       multivitamin with minerals Tabs  Take 1 tablet by mouth daily.     omeprazole 40 MG capsule  Commonly known as:  PRILOSEC  Take 40 mg by mouth daily.     ondansetron 4 MG disintegrating tablet  Commonly known as:  ZOFRAN ODT  Take 1 tablet (4 mg total) by mouth every 8 (eight) hours as needed for nausea.     oxyCODONE-acetaminophen 5-325 MG/5ML solution  Commonly known as:  ROXICET  Take 5-7.5 mLs by mouth every 4 (four) hours as needed.     pantoprazole sodium 40 mg/20 mL Pack  Commonly known as:  PROTONIX  Take 20 mLs (40 mg total) by mouth daily.     topiramate 25 MG tablet  Commonly known as:  TOPAMAX  Take 50 mg by mouth every morning.         SignedLodema Pilot DAVID 09/02/2012, 8:58 AM

## 2012-09-02 NOTE — Progress Notes (Signed)
Discharge instructions and prescriptions given.  No questions asked, verbalized understanding.  Left via wheelchair with family member. Rubye Oaks

## 2012-09-02 NOTE — Progress Notes (Signed)
Patient alert and oriented. Significant other at bedside. Reviewed bariatric discharge instructions using Te[employer] Back. Patient verbalized understanding.

## 2012-09-02 NOTE — Progress Notes (Signed)
2 Days Post-Op  Subjective: Feeling better.  The right sided pain is improved.  No nausea and oral pain meds working well.  Objective: Vital signs in last 24 hours: Temp:  [97.3 F (36.3 C)-98.4 F (36.9 C)] 98 F (36.7 C) (06/26 0600) Pulse Rate:  [61-67] 63 (06/26 0600) Resp:  [20] 20 (06/26 0600) BP: (107-138)/(65-77) 107/72 mmHg (06/26 0600) SpO2:  [100 %] 100 % (06/26 0600) Last BM Date: 08/30/12  Intake/Output from previous day: 06/25 0701 - 06/26 0700 In: 4123.3 [P.O.:1140; I.V.:2983.3] Out: 3070 [Urine:3000; Drains:70] Intake/Output this shift:    General appearance: alert, cooperative and no distress Resp: nonlabored Cardio: normal rate GI: soft, mild incisional tenderness, ND, wounds without infection, JP ss, no peritoneal signs  Lab Results:   Recent Labs  09/01/12 0420 09/02/12 0420  WBC 11.2* 7.5  HGB 12.0 10.9*  HCT 37.9 35.3*  PLT 264 219   BMET  Recent Labs  09/01/12 0420  NA 135  K 4.0  CL 102  CO2 27  GLUCOSE 152*  BUN 6  CREATININE 0.65  CALCIUM 9.1   PT/INR No results found for this basename: LABPROT, INR,  in the last 72 hours ABG No results found for this basename: PHART, PCO2, PO2, HCO3,  in the last 72 hours  Studies/Results: No results found.  Anti-infectives: Anti-infectives   Start     Dose/Rate Route Frequency Ordered Stop   08/31/12 1315  ertapenem (INVANZ) 1 g in sodium chloride 0.9 % 50 mL IVPB     1 g 100 mL/hr over 30 Minutes Intravenous  Once 08/31/12 1305 08/31/12 1401      Assessment/Plan: s/p Procedure(s) with comments: LAPAROSCOPIC GASTRIC SLEEVE RESECTION (N/A) - Laparoscopic Sleeve Gastrectomy with EGD she looks fine and no evidence of postop complications.  wbc normal and pain improved.  should be okay for d/c  LOS: 2 days    Lodema Pilot DAVID 09/02/2012

## 2012-09-09 ENCOUNTER — Encounter: Payer: BC Managed Care – PPO | Attending: General Surgery | Admitting: *Deleted

## 2012-09-09 DIAGNOSIS — Z713 Dietary counseling and surveillance: Secondary | ICD-10-CM | POA: Insufficient documentation

## 2012-09-14 ENCOUNTER — Ambulatory Visit: Payer: BC Managed Care – PPO

## 2012-09-17 ENCOUNTER — Ambulatory Visit: Payer: Self-pay | Admitting: Nurse Practitioner

## 2012-09-27 NOTE — Progress Notes (Addendum)
Bariatric Class:  Appt start time: 1730   End time:  1830.  Pre-Operative Nutrition Class  Patient was seen on 08/12/12 for Pre-Operative Bariatric Surgery Education [facility].   Surgery date: 08/31/12 Surgery type: Sleeve Gastrectomy Start weight at [facility]: 382.8 lbs (02/18/12)  Weight today: 371.0 lbs Weight change: 11.8 lbs Total weight lost: 11.8 lbs  TANITA  BODY COMP RESULTS  08/12/12   BMI (kg/m^2) 59.0   Fat Mass (lbs) 207.0   Fat Free Mass (lbs) 164.0   Total Body Water (lbs) 120.0   Samples given per MNT protocol; Patient educated on appropriate usage: Bariatric Advantage Multivitamin Lot # [id number] Exp: 10/15  Bariatric Advantage Sublingual B12 Lot # [id number] Exp: 10/15  Celebrate Vitamins Calcium Citrate Lot # [id number] Exp: 08/15  Corliss Marcus Protein Powder Lot # [id number] Exp: 06/15  Premier Protein Shake Lot # [id number] Exp: 03/22/13  The following the learning objective met by the patient during this course:  Identify Pre-Op Dietary Goals and will begin 2 weeks pre-operatively  Identify appropriate sources of fluids and proteins   State protein recommendations and appropriate sources pre and post-operatively  Identify Post-Operative Dietary Goals and will follow for 2 weeks post-operatively  Identify appropriate multivitamin and calcium sources  Describe the need for physical activity post-operatively and will follow MD recommendations  State when to call healthcare provider regarding medication questions or post-operative complications  Handouts given during class include:  Pre-Op Bariatric Surgery Diet Handout  Protein Shake Handout  Post-Op Bariatric Surgery Nutrition Handout  BELT Program Information Flyer  Support Group Information Flyer  [facility] Bariatric Supplements Price List  Follow-Up Plan: Patient will follow-up at [facility] 2 weeks post operatively for diet advancement per MD.

## 2012-09-30 ENCOUNTER — Encounter (INDEPENDENT_AMBULATORY_CARE_PROVIDER_SITE_OTHER): Payer: Self-pay | Admitting: General Surgery

## 2012-09-30 ENCOUNTER — Ambulatory Visit (INDEPENDENT_AMBULATORY_CARE_PROVIDER_SITE_OTHER): Payer: BC Managed Care – PPO | Admitting: General Surgery

## 2012-09-30 VITALS — BP 118/68 | HR 68 | Temp 99.2°F | Resp 15 | Ht 66.0 in | Wt 332.8 lb

## 2012-09-30 DIAGNOSIS — Z4889 Encounter for other specified surgical aftercare: Secondary | ICD-10-CM

## 2012-09-30 DIAGNOSIS — Z5189 Encounter for other specified aftercare: Secondary | ICD-10-CM

## 2012-09-30 LAB — BASIC METABOLIC PANEL
BUN: 11 mg/dL (ref 6–23)
CO2: 25 mEq/L (ref 19–32)
Chloride: 100 mEq/L (ref 96–112)
Glucose, Bld: 89 mg/dL (ref 70–99)
Potassium: 3.7 mEq/L (ref 3.5–5.3)

## 2012-09-30 LAB — CBC WITH DIFFERENTIAL/PLATELET
HCT: 38 % (ref 36.0–46.0)
Hemoglobin: 12.7 g/dL (ref 12.0–15.0)
Lymphocytes Relative: 32 % (ref 12–46)
Monocytes Absolute: 0.6 10*3/uL (ref 0.1–1.0)
Monocytes Relative: 8 % (ref 3–12)
Neutro Abs: 4.7 10*3/uL (ref 1.7–7.7)
WBC: 7.9 10*3/uL (ref 4.0–10.5)

## 2012-09-30 NOTE — Progress Notes (Signed)
Subjective:     Patient ID: Katherine Baldwin, female   DOB: [birth year], 37 y.o.   MRN: [id number]  HPI This patient follows up 1 month status post laparoscopic vertical sleeve gastrectomy. She seems to be doing very well. She has lost 32 pounds and feels much better than preoperatively. She has not been taking any of her NSAIDs for her arthritis. She has vomited twice which she says was associated with eating too fast. She has been drinking water but has problems with other liquids. She says it causes some cramping and doesn't make her feel very comfortable so she is maintained her hydration with water and certain flavors of crystal light.  She is taking in at least 1 L per day. She has had some constipation in requires MiraLAX to move her bowels. She is having hunger and occasional reflux but her Prilosec S. Help with this. She is taking her protein supplements and her multivitamins and she is currently off all of her pain medications. She walks about 30 minutes per day and otherwise has no complaints.  Review of Systems     Objective:   Physical Exam No acute distress and nontoxic-appearing sitting comfortably in the chair Her abdomen is soft and nontender on exam her incisions are healing nicely without any sign of infection. There is no evidence of any incisional hernia at her extraction site. No evidence of neurologic deficits    Assessment:     Status post vertical sleeve gastrectomy-doing well She seems to be doing well from her procedure without any evidence of any postoperative complications she has several complaints which I think are appropriate for this stage postoperatively. She has done very well from a weight loss standpoint. She is taking her vitamins and protein supplements. I recommended that she increase her fluid intake and I would like to check some laboratory studies today and make sure that she is staying hydrated. She can resume taking pills and tablets for her medications  and gradually increase her activity and diet as tolerated    Plan:     Continue hydration and multivitamins and protein Increase diet to bariatric diet and increase physical activity as tolerated We'll check some basic laboratory studies today with cbc and bmp and ensure that she is staying hydrated and there's no signs of dehydration. F/u in 2 months and we'll check full nutrition labs at that time

## 2012-10-17 NOTE — Patient Instructions (Addendum)
Patient to follow Phase 3A-Soft, High Protein Diet and follow-up at [facility] in 6 weeks for 2 months post-op nutrition visit for diet advancement. 

## 2012-10-17 NOTE — Progress Notes (Signed)
Bariatric Class:  Appt start time: 1600  End time: 1700.  2 Week Post-Operative Nutrition Class  Patient was seen on 09/09/12 for Post-Operative Nutrition education at [facility].   Surgery date: 08/31/12  Surgery type: Sleeve Gastrectomy  Start weight at [facility]: 382.8 lbs (02/18/12)   Weight today: 344.5 lbs  Weight change: 26.5 lbs  Total weight lost: 38.3 lbs   Weight loss goal: 180-200 lbs % goal met: 19-21%  TANITA BODY COMP RESULTS   08/12/12  09/08/12  BMI (kg/m^2)  59.0  55.6  Fat Mass (lbs)  207.0  129.0  Fat Free Mass (lbs)  164.0  215.5  Total Body Water (lbs)  120.0  158.0   The following the learning objectives were met by the patient during this course:  Identifies Phase 3A (Soft, High Proteins) Dietary Goals and will begin from 2 weeks post-operatively to 2 months post-operatively  Identifies appropriate sources of fluids and proteins   States protein recommendations and appropriate sources post-operatively  Identifies the need for appropriate texture modifications, mastication, and bite sizes when consuming solids  Identifies appropriate multivitamin and calcium sources post-operatively  Describes the need for physical activity post-operatively and will follow MD recommendations  States when to call healthcare provider regarding medication questions or post-operative complications  Handouts given during class include:  Phase 3A: Soft, High Protein Diet Handout  Follow-Up Plan: Patient will follow-up at [facility] in 6 weeks for 2 months post-op nutrition visit for diet advancement per MD.

## 2012-10-21 ENCOUNTER — Encounter: Payer: BC Managed Care – PPO | Attending: General Surgery | Admitting: *Deleted

## 2012-10-21 ENCOUNTER — Encounter: Payer: Self-pay | Admitting: *Deleted

## 2012-10-21 DIAGNOSIS — Z713 Dietary counseling and surveillance: Secondary | ICD-10-CM | POA: Insufficient documentation

## 2012-10-21 NOTE — Patient Instructions (Addendum)
Goals:  Follow Phase 3B: High Protein + Non-Starchy Vegetables  Eat 3-6 small meals/snacks, every 3-5 hrs  Increase lean protein foods to meet 60-80g goal  Increase fluid intake to 64oz +  Avoid drinking 15 minutes before, during and 30 minutes after eating  Aim for >30 min of physical activity daily 

## 2012-10-21 NOTE — Progress Notes (Addendum)
Follow-up visit:  8 Weeks Post-Operative Sleeve Gastrectomy Surgery  Medical Nutrition Therapy:  Appt start time: 1230   End time: 1300.  Primary concerns today: Post-operative Bariatric Surgery Nutrition Management. Katherine Baldwin returns for f/u 8 weeks s/p RYGB surgery. Doing very well, though still struggles to get protein and fluids in.  Reports a lack of appetite, though no meal skipping noted.   Surgery date: 08/31/12  Surgery type: Sleeve Gastrectomy  Start weight at [facility]: 382.8 lbs (02/18/12)   Weight today: 326.0 lbs  Weight change: 18.5 lbs  Total weight lost: 56.8 lbs   Weight loss goal: 180-200 lbs % goal met: 28-31 %  TANITA BODY COMP RESULTS   08/12/12  09/08/12 10/21/12  BMI (kg/m^2)  59.0  55.6 52.6  Fat Mass (lbs)  207.0  129.0 176.5  Fat Free Mass (lbs)  164.0  215.5 149.5  Total Body Water (lbs)  120.0  158.0 109.5   24-hr recall:  Will obtain at next visit d/t time constraints  Fluid intake: 30-40 oz  Estimated total protein intake: 20-30 g  Medications: No changes reported Supplementation: Taking  Using straws: No Drinking while eating: No Hair loss: No Carbonated beverages: No N/V/D/C: Recent constipation resolved.  Dumping syndrome: No  Recent physical activity:  Treadmill 45 min/day @ 3-4 days/week  Progress Towards Goal(s):  In progress.  Handouts given during visit include:  Phase 3B: High Protein + Non-Starchy Vegetables    Samples given during visit include:   BariActive MVI - 5 pkts Lot: [id number]; Exp: 05/16  BariActive Calcium Citrate - 5 pkts Lot: [id number]; Exp: 05/16  BariActive Iron - 8 pkts Lot: [id number]; Exp: 05/16  Celebrate Calcium Citrate (Berries & Cream) - 6 tabs  Lot: ; Exp:   Celebrate Iron + C (30 mg): 6 tabs Lot: [id number]; Exp: 01/16  Premier Protein Shake: 2 bottles Lot:  [id number]; Exp: 03/22/13    Nutritional Diagnosis:  [place]-3.3 Overweight/obesity related to past poor dietary habits and physical inactivity as evidenced  by patient w/ recent Sleeve Gastrectomy surgery following dietary guidelines for continued weight loss.    Intervention:  Nutrition education/diet advancement.  Monitoring/Evaluation:  Dietary intake, exercise, and body weight. Follow up in 1 months for 3 month post-op visit.

## 2012-11-18 ENCOUNTER — Ambulatory Visit: Payer: BC Managed Care – PPO | Admitting: *Deleted

## 2012-12-02 ENCOUNTER — Ambulatory Visit (INDEPENDENT_AMBULATORY_CARE_PROVIDER_SITE_OTHER): Payer: BC Managed Care – PPO | Admitting: General Surgery

## 2012-12-02 ENCOUNTER — Other Ambulatory Visit (INDEPENDENT_AMBULATORY_CARE_PROVIDER_SITE_OTHER): Payer: Self-pay | Admitting: General Surgery

## 2012-12-02 ENCOUNTER — Encounter (INDEPENDENT_AMBULATORY_CARE_PROVIDER_SITE_OTHER): Payer: Self-pay | Admitting: General Surgery

## 2012-12-02 DIAGNOSIS — Z6841 Body Mass Index (BMI) 40.0 and over, adult: Secondary | ICD-10-CM

## 2012-12-02 DIAGNOSIS — K912 Postsurgical malabsorption, not elsewhere classified: Secondary | ICD-10-CM

## 2012-12-02 LAB — IRON AND TIBC
%SAT: 11 % — ABNORMAL LOW (ref 20–55)
Iron: 35 ug/dL — ABNORMAL LOW (ref 42–145)
UIBC: 298 ug/dL (ref 125–400)

## 2012-12-02 NOTE — Progress Notes (Signed)
Subjective:     Patient ID: Katherine Baldwin, female   DOB: 1975/11/29, 37 y.o.   MRN: [id number]  HPI This patient follows up today treatment status post vertical sleeve gastrectomy for obesity. She has no complaints and has lost about 56 pounds since her procedure.  She is started working out with a Systems analyst about 4 weeks ago as it is doing cardio about 3 days per week. She has no nausea vomiting or food intolerance she still takes her heartburn medication about 3 times per week but this is actually improved since before her procedure. Prior to her sleeve she was taking her heartburn medication daily. She says it is certainly not worse. She moves her bowels about 4 times per week and still has some difficulty with this but she is not taking any stool softeners. She continue taking her protein and vitamin supplements and really has no complaints. She denies any neurologic symptoms or numbness or tingling  Review of Systems     Objective:   Physical Exam No acute distress and nontoxic-appearing Her abdomen is soft and nontender on exam her incisions are healing nicely without any sign of infection. There is no evidence of any neurologic deficits     Assessment:     Morbid obesity status post vertical sleeve gastrectomy She is doing well from her procedure without any evidence of postoperative complications. She has lost a 56 pounds and continues to lose weight. I recommended that she increase her cardio and continued to focus on her dietary modifications. We will check some laboratory studies to ensure that her vitamin levels are adequate. She will continue taking her multivitamins and protein and I will see her back in about 3 months at her 6 month visit     Plan:     Continue with high protein low carbohydrate diet and vitamin supplements Continue with exercise and I will see her back in 3 months We will check some vitamin labs and she will call me back for the results

## 2012-12-06 ENCOUNTER — Encounter (INDEPENDENT_AMBULATORY_CARE_PROVIDER_SITE_OTHER): Payer: Self-pay

## 2012-12-10 ENCOUNTER — Telehealth (INDEPENDENT_AMBULATORY_CARE_PROVIDER_SITE_OTHER): Payer: Self-pay

## 2012-12-10 NOTE — Telephone Encounter (Signed)
Spoke to patient regarding iron levels.  Patient re[ports taking an iron supplement daily as well as [employer] that contains iron.  Patient states she has heavy menstrual cycles and wasn't sure if that could be the cause of her low iron levels.  Patient will talk with Huntley Dec, Nutritionist for recommendations and will call our office back at a later time.

## 2012-12-10 NOTE — Telephone Encounter (Signed)
Message copied by Maryan Puls on Fri Dec 10, 2012  1:29 PM ------      Message from: Lodema Pilot      Created: Thu Dec 09, 2012 10:11 AM       Her Iron is a little low.  Could you please call her and recommend that she add an iron supplement or discuss with sara iron supplementation? ------

## 2012-12-20 ENCOUNTER — Encounter: Payer: Self-pay | Admitting: *Deleted

## 2012-12-20 ENCOUNTER — Encounter: Payer: BC Managed Care – PPO | Attending: General Surgery | Admitting: *Deleted

## 2012-12-20 DIAGNOSIS — Z713 Dietary counseling and surveillance: Secondary | ICD-10-CM | POA: Insufficient documentation

## 2012-12-20 NOTE — Patient Instructions (Addendum)
Goals:  Follow Phase 3B: High Protein + Non-Starchy Vegetables  Increase lean protein foods to meet 60-80g goal  Increase fluid intake to 64oz +  Add 15 grams of carbohydrate (fruit, whole grain) with meals  Avoid corn, peas, and all potatoes until Thanksgiving (at least)  Avoid drinking 15 minutes before, during and 30 minutes after eating  Aim for >30 min of physical activity daily  Try [facility] [employer] [facility] [place]) @ [address] of [address].

## 2012-12-20 NOTE — Progress Notes (Signed)
Follow-up visit:  12 Weeks Post-Operative Sleeve Gastrectomy Surgery  Medical Nutrition Therapy:  Appt start time:  1630   End time:  1700.  Primary concerns today: Post-operative Bariatric Surgery Nutrition Management. Katherine Baldwin returns for f/u 12 weeks s/p RYGB surgery. Doing very well,   Surgery date: 08/31/12  Surgery type: Sleeve Gastrectomy  Start weight at [facility]: 382.8 lbs (02/18/12)   Weight today: 301.0 lbs  Weight change: 25.0 lbs  Total weight lost: 81.8 lbs   Weight loss goal: 180-200 lbs % goal met:  %  TANITA BODY COMP RESULTS   08/12/12  09/08/12 10/21/12 12/20/12  BMI (kg/m^2)  59.0  55.6 52.6 48.6  Fat Mass (lbs)  207.0  129.0 176.5 150.0  Fat Free Mass (lbs)  164.0  215.5 149.5 151.0  Total Body Water (lbs)  120.0  158.0 109.5 110.5   24-hr recall:  Will obtain at next visit d/t time constraints  Fluid intake: water, protein shakes (11 oz); 30-40 oz  Estimated total protein intake:  50-60g (30g Premier protein shake, 20g)  Medications: No changes reported Supplementation: Taking  Using straws: No Drinking while eating: No Hair loss: No Carbonated beverages: No N/V/D/C: Recent constipation resolved.  Dumping syndrome: No  Recent physical activity:  Treadmill or walking 45 min/day @ 3-4 days/week; works with Systems analyst 3-4 days (other days)  Progress Towards Goal(s):  In progress.  Nutritional Diagnosis:  -3.3 Overweight/obesity related to past poor dietary habits and physical inactivity as evidenced by patient w/ recent Sleeve Gastrectomy surgery following dietary guidelines for continued weight loss.    Intervention:  Nutrition education/diet advancement.  Monitoring/Evaluation:  Dietary intake, exercise, and body weight. Follow up in 3 months for56month post-op visit.

## 2013-03-21 ENCOUNTER — Ambulatory Visit: Payer: BC Managed Care – PPO | Admitting: *Deleted

## 2013-03-28 ENCOUNTER — Ambulatory Visit (INDEPENDENT_AMBULATORY_CARE_PROVIDER_SITE_OTHER): Payer: BC Managed Care – PPO | Admitting: Gynecology

## 2013-03-28 ENCOUNTER — Encounter: Payer: Self-pay | Admitting: Gynecology

## 2013-03-28 VITALS — BP 116/78 | HR 84 | Resp 16 | Ht 65.75 in | Wt 276.0 lb

## 2013-03-28 DIAGNOSIS — Z124 Encounter for screening for malignant neoplasm of cervix: Secondary | ICD-10-CM

## 2013-03-28 DIAGNOSIS — K5900 Constipation, unspecified: Secondary | ICD-10-CM

## 2013-03-28 DIAGNOSIS — Z0000 Encounter for general adult medical examination without abnormal findings: Secondary | ICD-10-CM

## 2013-03-28 DIAGNOSIS — Z01419 Encounter for gynecological examination (general) (routine) without abnormal findings: Secondary | ICD-10-CM

## 2013-03-28 LAB — POCT URINALYSIS DIPSTICK
Urobilinogen, UA: NEGATIVE
pH, UA: 5

## 2013-03-28 LAB — HEMOGLOBIN, FINGERSTICK: Hemoglobin, fingerstick: 12.9 g/dL (ref 12.0–16.0)

## 2013-03-28 NOTE — Progress Notes (Signed)
38 y.o. single [place] American female   [id number] here for annual exam. Pt is currently sexually active.  Pt is Psychologist, educational for [employer].  Pt had gastric sleeve done 6/14, she has lost 100# thus far.  Pt reports cycles are regular, heavy.  Pt reports using 18 pads over 3d.  No clots. No post-coital bleeding.  Partner 10y.  Pt has not tried to conceive to date.   Pt reports low back pain before defecation, pt has issues with constipation, worse since bypass.  Pt reports loss of urine small amounts with cough, sneeze and laugh  Patient's last menstrual period was 03/04/2013.          Sexually active: yes  The current method of family planning is condoms most of the time.    Exercising: yes  strength training, cardio 3 days (strength) ; 2-3 days (cardio) Last pap: 2-3 years ago Abnormal Alcohol: no Tobacco: no BSE: yes   Hgb: 12.9 ; Urine: Trace Protein    Health Maintenance  Topic Date Due  . Tetanus/tdap  03/02/1995  . Influenza Vaccine  10/08/2012  . Pap Smear  07/30/2014    Family History  Problem Relation Age of Onset  . Diabetes Paternal Grandfather   . Diabetes Paternal Grandmother   . Diabetes Maternal Grandmother   . Diabetes Maternal Grandfather   . Cancer Father     prostate, gallbladder, prostate breast, stomach  . Arthritis Father     rhematoid  . Diabetes Father     Prediabetes  . Diabetes Mother   . Asthma Mother   . Hypertension Mother   . Fibroids Sister   . Thyroid disease Sister     Patient Active Problem List   Diagnosis Date Noted  . Dysmenorrhea 07/30/2011  . Menorrhagia 07/30/2011  . Morbid obesity 07/30/2011  . Trichomonas     Past Medical History  Diagnosis Date  . Trichomonas   . Chicken pox   . Abnormal Pap smear 1998    no treatment required  . Morbid obesity with body mass index of 60.0-69.9 in adult   . Bronchitis     hx of  . Multiple allergies   . Headache(784.0)     migraines  . GERD (gastroesophageal reflux  disease)     mild  . Arthritis     knees  . Orbital pseudotumor   . Family history of anesthesia complication     N/V, extreme chills    Past Surgical History  Procedure Laterality Date  . Spinal tap    . No past surgeries    . Laparoscopic gastric sleeve resection N/A 08/31/2012    Procedure: LAPAROSCOPIC GASTRIC SLEEVE RESECTION;  Surgeon: Lodema Pilot, DO;  Location: [facility];  Service: General;  Laterality: N/A;  Laparoscopic Sleeve Gastrectomy with EGD    Allergies: Tree extract; Fruit & vegetable daily; and Peanuts  Current Outpatient Prescriptions  Medication Sig Dispense Refill  . CALCIUM PO Take by mouth.      . Cyanocobalamin (VITAMIN B 12 PO) Take by mouth. Nasal spray      . FLUOCINOLONE ACETONIDE SCALP 0.01 % OIL as needed.      . IRON PO Take by mouth.      . Multiple Vitamin (MULITIVITAMIN WITH MINERALS) TABS Take 1 tablet by mouth daily.      Marland Kitchen omeprazole (PRILOSEC) 40 MG capsule Take 40 mg by mouth daily.       No current facility-administered medications for this visit.  ROS: Pertinent items are noted in HPI.  Exam:    BP 116/78  Pulse 84  Resp 16  Ht 5' 5.75" (1.67 m)  Wt 276 lb (125.193 kg)  BMI 44.89 kg/m2  LMP 03/04/2013 Weight change: @WEIGHTCHANGE @ Last 3 height recordings:  Ht Readings from Last 3 Encounters:  03/28/13 5' 5.75" (1.67 m)  12/20/12 5\' 6"  (1.676 m)  12/02/12 5\' 6"  (1.676 m)   General appearance: alert, cooperative and appears stated age Head: Normocephalic, without obvious abnormality, atraumatic Neck: no adenopathy, no carotid bruit, no JVD, supple, symmetrical, trachea midline and thyroid not enlarged, symmetric, no tenderness/mass/nodules Lungs: clear to auscultation bilaterally Breasts: normal appearance, no masses or tenderness Heart: regular rate and rhythm, S1, S2 normal, no murmur, click, rub or gallop Abdomen: soft, non-tender; bowel sounds normal; no masses,  no organomegaly Extremities: extremities normal,  atraumatic, no cyanosis or edema Skin: Skin color, texture, turgor normal. No rashes or lesions Lymph nodes: Cervical, supraclavicular, and axillary nodes normal. no inguinal nodes palpated Neurologic: Grossly normal   Pelvic: External genitalia:  no lesions              Urethra: normal appearing urethra with no masses, tenderness or lesions              Bartholins and Skenes: normal                 Vagina: normal appearing vagina with normal color and discharge, no lesions              Cervix: normal appearance              Pap taken: yes        Bimanual Exam:  Uterus:  uterus is normal size, shape, consistency and nontender, retroverted                                      Adnexa:    normal adnexa in size, nontender and no masses                                      Rectovaginal: Confirms                                      Anus:  normal sphincter tone, no lesions  A: well woman Contraceptive management Constipation Stress incontinence     P: pap smear with HRHPV Information on skyla iud-needs contraception for 1y-579m after bypass Discussed affect of abdominal wall weight on bladder-expect to improve with weight loss Discussed fiber, exercise on constipation, will check mg level counseled on breast self exam, adequate intake of calcium and vitamin D, diet and exercise return annually or prn   An After Visit Summary was printed and given to the patient.

## 2013-03-28 NOTE — Patient Instructions (Signed)
Levonorgestrel intrauterine device (IUD) What is this medicine? LEVONORGESTREL IUD (LEE voe nor jes trel) is a contraceptive (birth control) device. The device is placed inside the uterus by a healthcare professional. It is used to prevent pregnancy and can also be used to treat heavy bleeding that occurs during your period. Depending on the device, it can be used for 3 to 5 years. This medicine may be used for other purposes; ask your health care provider or pharmacist if you have questions. COMMON BRAND NAME(S): Mirena, Skyla What should I tell my health care provider before I take this medicine? They need to know if you have any of these conditions: -abnormal Pap smear -cancer of the breast, uterus, or cervix -diabetes -endometritis -genital or pelvic infection now or in the past -have more than one sexual partner or your partner has more than one partner -heart disease -history of an ectopic or tubal pregnancy -immune system problems -IUD in place -liver disease or tumor -problems with blood clots or take blood-thinners -use intravenous drugs -uterus of unusual shape -vaginal bleeding that has not been explained -an unusual or allergic reaction to levonorgestrel, other hormones, silicone, or polyethylene, medicines, foods, dyes, or preservatives -pregnant or trying to get pregnant -breast-feeding How should I use this medicine? This device is placed inside the uterus by a health care professional. Talk to your pediatrician regarding the use of this medicine in children. Special care may be needed. Overdosage: If you think you have taken too much of this medicine contact a poison control center or emergency room at once. NOTE: This medicine is only for you. Do not share this medicine with others. What if I miss a dose? This does not apply. What may interact with this medicine? Do not take this medicine with any of the following  medications: -amprenavir -bosentan -fosamprenavir This medicine may also interact with the following medications: -aprepitant -barbiturate medicines for inducing sleep or treating seizures -bexarotene -griseofulvin -medicines to treat seizures like carbamazepine, ethotoin, felbamate, oxcarbazepine, phenytoin, topiramate -modafinil -pioglitazone -rifabutin -rifampin -rifapentine -some medicines to treat HIV infection like atazanavir, indinavir, lopinavir, nelfinavir, tipranavir, ritonavir -St. John's wort -warfarin This list may not describe all possible interactions. Give your health care provider a list of all the medicines, herbs, non-prescription drugs, or dietary supplements you use. Also tell them if you smoke, drink alcohol, or use illegal drugs. Some items may interact with your medicine. What should I watch for while using this medicine? Visit your doctor or health care professional for regular check ups. See your doctor if you or your partner has sexual contact with others, becomes HIV positive, or gets a sexual transmitted disease. This product does not protect you against HIV infection (AIDS) or other sexually transmitted diseases. You can check the placement of the IUD yourself by reaching up to the top of your vagina with clean fingers to feel the threads. Do not pull on the threads. It is a good habit to check placement after each menstrual period. Call your doctor right away if you feel more of the IUD than just the threads or if you cannot feel the threads at all. The IUD may come out by itself. You may become pregnant if the device comes out. If you notice that the IUD has come out use a backup birth control method like condoms and call your health care provider. Using tampons will not change the position of the IUD and are okay to use during your period. What side effects may I   notice from receiving this medicine? Side effects that you should report to your doctor or  health care professional as soon as possible: -allergic reactions like skin rash, itching or hives, swelling of the face, lips, or tongue -fever, flu-like symptoms -genital sores -high blood pressure -no menstrual period for 6 weeks during use -pain, swelling, warmth in the leg -pelvic pain or tenderness -severe or sudden headache -signs of pregnancy -stomach cramping -sudden shortness of breath -trouble with balance, talking, or walking -unusual vaginal bleeding, discharge -yellowing of the eyes or skin Side effects that usually do not require medical attention (report to your doctor or health care professional if they continue or are bothersome): -acne -breast pain -change in sex drive or performance -changes in weight -cramping, dizziness, or faintness while the device is being inserted -headache -irregular menstrual bleeding within first 3 to 6 months of use -nausea This list may not describe all possible side effects. Call your doctor for medical advice about side effects. You may report side effects to FDA at 1-800-FDA-1088. Where should I keep my medicine? This does not apply. NOTE: This sheet is a summary. It may not cover all possible information. If you have questions about this medicine, talk to your doctor, pharmacist, or health care provider.  2014, Elsevier/Gold Standard. (2011-03-27 13:54:04)  

## 2013-03-29 LAB — MAGNESIUM: MAGNESIUM: 2 mg/dL (ref 1.5–2.5)

## 2013-03-31 LAB — IPS PAP TEST WITH HPV

## 2013-04-18 ENCOUNTER — Ambulatory Visit: Payer: BC Managed Care – PPO | Admitting: Dietician

## 2013-09-06 ENCOUNTER — Encounter: Payer: Self-pay | Admitting: Gynecology

## 2013-10-28 ENCOUNTER — Telehealth: Payer: Self-pay | Admitting: Gynecology

## 2013-10-28 NOTE — Telephone Encounter (Signed)
Patient says her father passed away recently and (four different cancers). His oncologist suggested the family get gene testing. Is this something she can arrange from our office?

## 2013-10-31 NOTE — Telephone Encounter (Signed)
Message left to return call to [place] at [phone].   Will need office visit with Dr. Farrel Gobble or pcp to determine if needs referral for genetic testing.

## 2013-10-31 NOTE — Telephone Encounter (Signed)
Patient would like appointment with Dr. Farrel Gobble to dicuss possible genetic testing.  Appointment scheduled, patient requests Friday afternoons. Scheduled for 11/25/13 at 1300.  Routing to provider for final review. Patient agreeable to disposition. Will close encounter

## 2013-11-25 ENCOUNTER — Telehealth: Payer: Self-pay | Admitting: Gynecology

## 2013-11-25 ENCOUNTER — Ambulatory Visit: Payer: BC Managed Care – PPO | Admitting: Gynecology

## 2013-11-25 NOTE — Telephone Encounter (Signed)
Called and spoke with patient to reschedule her appointment from 11/25/13 with Dr. Farrel Gobble due to surgery conflict. Patient rescheduled for 12/19/13 for "genetic testing." Patient wants to know if she can have a direct referral to a genetic counselor instead of meeting with Dr. Farrel Gobble?

## 2013-11-25 NOTE — Telephone Encounter (Signed)
Dr. Farrel Gobble, patient requesting referral for genetics testing.  Patient's father recently deceased with cancer and states prior that onc suggested genetics testing.  Okay to place referral?

## 2013-11-28 NOTE — Telephone Encounter (Signed)
Spoke with Dr. Farrel Gobble,  Dr. Farrel Gobble needs consult/office visit in order to place referral.  Patient notified and agreeable. She has appointment for consult scheduled.  Routing to provider for final review. Patient agreeable to disposition. Will close encounter

## 2013-12-16 ENCOUNTER — Ambulatory Visit: Payer: BC Managed Care – PPO | Admitting: Gynecology

## 2013-12-19 ENCOUNTER — Encounter: Payer: Self-pay | Admitting: Gynecology

## 2013-12-19 ENCOUNTER — Ambulatory Visit (INDEPENDENT_AMBULATORY_CARE_PROVIDER_SITE_OTHER): Payer: BC Managed Care – PPO | Admitting: Gynecology

## 2013-12-19 VITALS — BP 120/74 | Resp 12 | Ht 65.75 in | Wt 167.0 lb

## 2013-12-19 DIAGNOSIS — Z803 Family history of malignant neoplasm of breast: Secondary | ICD-10-CM

## 2013-12-19 DIAGNOSIS — Z9189 Other specified personal risk factors, not elsewhere classified: Secondary | ICD-10-CM

## 2013-12-19 DIAGNOSIS — Z808 Family history of malignant neoplasm of other organs or systems: Secondary | ICD-10-CM

## 2013-12-19 HISTORY — DX: Other specified personal risk factors, not elsewhere classified: Z91.89

## 2013-12-19 HISTORY — DX: Family history of malignant neoplasm of breast: Z80.3

## 2013-12-19 NOTE — Progress Notes (Signed)
Pt here to discuss genetic risks for cancer. Pt's father was diagnosed with breast cancer at 63, he later had prostate cancer and then gallbladder/stomach that metastasized.  He recently passed away.  Pt's father never had any genetic testing done.  She has 3 other siblings-2 sisters and 1 brother. We discussed the most common genetic mutation would be BRCA2 based on the myriad of cancers that he had but that there are also other mutations that are possible. We reviewed the associated risks for breast, ovarian, pancreatic, skin and prophylactic measures that she can take. Suggest one of her siblings should be tested and if + others checked against the mutation. In the meantime, suggest starting mammogram screening now with 3D mammogram, MRI and breast exam.  In addition, she may want to have PUS to evaluate her ovaries.  She is recently married and would like to conceive soon based on age.  She may want to consider ocp prophylaxis between children. Additional risks are nulliparous state, obesity were reviewed. Pt has lost over 100# with gastric sleeve Pt would like referral to genetics.  BP 120/74  Resp 12  Ht 5' 5.75" (1.67 m)  Wt 167 lb (75.751 kg)  BMI 27.16 kg/m2 Breasts: right breast normal without mass, skin or nipple changes or axillary nodes, left breast normal without mass, skin or nipple changes or axillary nodes, supraclaviculars LN negative.  3D mammogram PUS  Plan MRI in 24mQuestions addressed 319mpent counseling, >50% face to face

## 2013-12-19 NOTE — Progress Notes (Signed)
Patient is scheduled for 3D Screening Mammogram at [facility] for 01/09/14 at 1745. She is agreeable to time/date/location.

## 2013-12-20 ENCOUNTER — Telehealth: Payer: Self-pay | Admitting: Emergency Medicine

## 2013-12-20 DIAGNOSIS — Z803 Family history of malignant neoplasm of breast: Secondary | ICD-10-CM

## 2013-12-20 NOTE — Addendum Note (Signed)
Addended by: Douglass RiversLATHROP, Jeniah Kishi on: 12/20/2013 02:13 PM   Modules accepted: Orders

## 2013-12-21 ENCOUNTER — Telehealth: Payer: Self-pay | Admitting: Gynecology

## 2013-12-21 NOTE — Telephone Encounter (Signed)
Spoke with patient. Advised that per benefit quote received, she will be responsible for a $39 copay when she comes in for PUS. Patient agreeable. Scheduled PUS. Advised patient of 72 hour cancellation policy and $100 cancellation fee. Patient agreeable.

## 2013-12-27 NOTE — Telephone Encounter (Signed)
Patient notified of information about screening mammogram.  Patient requested update about genetics testing appointment. Advised would return her call with appointment information.  [facility] [address] [place] [address] Genetics appointment for 01/16/14 at 1400. Patient agreeable.    Routing update to Dr Farrel GobbleLathrop.   cc Cathrine MusterSabrina Franklin for appointment information.

## 2013-12-27 NOTE — Telephone Encounter (Signed)
Message left to return call to [place] at [phone].   Spoke with Bria, reference [id number] patient has current coverage.  Under CPT G0202, screening for breast cancer, mammogram should be covered at 100% of network allowable amount. Calling patient to advise screening should be covered, except for $50.00 3D cost which patient is aware of.    Patient will need MRI after screening per Dr. Farrel GobbleLathrop.

## 2014-01-09 ENCOUNTER — Other Ambulatory Visit: Payer: Self-pay | Admitting: Gynecology

## 2014-01-09 ENCOUNTER — Ambulatory Visit
Admission: RE | Admit: 2014-01-09 | Discharge: 2014-01-09 | Disposition: A | Discharge status: Home / Self Care | Payer: BC Managed Care – PPO | Source: Ambulatory Visit | Attending: Gynecology | Admitting: Gynecology

## 2014-01-09 DIAGNOSIS — Z1231 Encounter for screening mammogram for malignant neoplasm of breast: Secondary | ICD-10-CM

## 2014-01-09 DIAGNOSIS — Z803 Family history of malignant neoplasm of breast: Secondary | ICD-10-CM

## 2014-01-10 ENCOUNTER — Other Ambulatory Visit: Payer: BC Managed Care – PPO | Admitting: Gynecology

## 2014-01-10 ENCOUNTER — Other Ambulatory Visit: Payer: BC Managed Care – PPO

## 2014-01-11 ENCOUNTER — Other Ambulatory Visit: Payer: Self-pay

## 2014-01-11 DIAGNOSIS — Z1231 Encounter for screening mammogram for malignant neoplasm of breast: Secondary | ICD-10-CM

## 2014-01-12 ENCOUNTER — Telehealth: Payer: Self-pay | Admitting: Genetic Counselor

## 2014-01-12 NOTE — Telephone Encounter (Signed)
Returned patient's call regarding r/s appointment.  Left phone number and asked that she return my phone call.

## 2014-01-12 NOTE — Telephone Encounter (Signed)
patient called to r/s genetic appt to 11/16 @ 2 w/Karen Lowell GuitarPowell

## 2014-01-16 ENCOUNTER — Other Ambulatory Visit: Payer: BC Managed Care – PPO

## 2014-01-16 ENCOUNTER — Encounter: Payer: BC Managed Care – PPO | Admitting: Genetic Counselor

## 2014-01-17 ENCOUNTER — Other Ambulatory Visit: Payer: Self-pay

## 2014-01-17 ENCOUNTER — Ambulatory Visit
Admission: RE | Admit: 2014-01-17 | Discharge: 2014-01-17 | Disposition: A | Discharge status: Home / Self Care | Payer: BC Managed Care – PPO | Source: Ambulatory Visit

## 2014-01-17 DIAGNOSIS — Z1231 Encounter for screening mammogram for malignant neoplasm of breast: Secondary | ICD-10-CM

## 2014-01-17 NOTE — Addendum Note (Signed)
Addended by: Joeseph AmorFAST, Taven Strite L on: 01/17/2014 01:55 PM   Modules accepted: Orders

## 2014-01-19 ENCOUNTER — Encounter: Payer: Self-pay | Admitting: Obstetrics and Gynecology

## 2014-01-19 ENCOUNTER — Ambulatory Visit (INDEPENDENT_AMBULATORY_CARE_PROVIDER_SITE_OTHER): Payer: BC Managed Care – PPO

## 2014-01-19 ENCOUNTER — Ambulatory Visit (INDEPENDENT_AMBULATORY_CARE_PROVIDER_SITE_OTHER): Payer: BC Managed Care – PPO | Admitting: Obstetrics and Gynecology

## 2014-01-19 VITALS — BP 104/62 | HR 70 | Ht 65.75 in | Wt 271.0 lb

## 2014-01-19 DIAGNOSIS — Z808 Family history of malignant neoplasm of other organs or systems: Secondary | ICD-10-CM

## 2014-01-19 DIAGNOSIS — Z803 Family history of malignant neoplasm of breast: Secondary | ICD-10-CM

## 2014-01-19 NOTE — Progress Notes (Signed)
Subjective  Patient is here today for pelvic ultrasound to evaluate ovaries.  Family history of female breast cancer in her father.  Deceased now. Having genetic testing on 01/23/14.  Had mammogram done on 01/17/14 - BI-RADS1. Planning on starting a family.  Having regular menses.   Had gastric sleeve and lost 140 pounds.  Objective  See ultrasound - images and report reviewed with patient.  Uterus - no masses, EMS 7.7 mm.  Normal ovaries.  Right ovary with 10.8 mm follicle.  No free fluid.     Assessment  Family history of female breast cancer.  Normal pelvic anatomy.  Obesity.  Status post gastric sleeve with tremendous weight loss. Desire for future fertility.   Plan  Proceed with genetic counseling and recommendations to follow.  I did recommend yearly 3D mammogram. Return for AEX in 10 weeks.  PNV.   Discussed resources for pregnancy planning. Literature suggested.  15 minutes face to face time of which over 50% was spent in counseling.   After visit summary to patient.

## 2014-01-23 ENCOUNTER — Ambulatory Visit (HOSPITAL_BASED_OUTPATIENT_CLINIC_OR_DEPARTMENT_OTHER): Payer: BC Managed Care – PPO | Admitting: Genetic Counselor

## 2014-01-23 ENCOUNTER — Encounter: Payer: Self-pay | Admitting: Genetic Counselor

## 2014-01-23 ENCOUNTER — Other Ambulatory Visit: Payer: BC Managed Care – PPO

## 2014-01-23 DIAGNOSIS — Z803 Family history of malignant neoplasm of breast: Secondary | ICD-10-CM

## 2014-01-23 DIAGNOSIS — Z315 Encounter for genetic counseling: Secondary | ICD-10-CM

## 2014-01-23 DIAGNOSIS — Z800 Family history of malignant neoplasm of digestive organs: Secondary | ICD-10-CM

## 2014-01-23 DIAGNOSIS — Z8042 Family history of malignant neoplasm of prostate: Secondary | ICD-10-CM

## 2014-01-23 DIAGNOSIS — Z808 Family history of malignant neoplasm of other organs or systems: Secondary | ICD-10-CM

## 2014-01-23 NOTE — Progress Notes (Signed)
Dr.  Josefa Half requested a consultation for genetic counseling and risk assessment for Katherine Baldwin, a 38 y.o. female, for discussion of her family history of female breast cancer, prostate cancer, gallbladder cancer and stomach cancer.  She presents to clinic today to discuss the possibility of a genetic predisposition to cancer, and to further clarify her risks, as well as her family members' risks for cancer.   HISTORY OF PRESENT ILLNESS: Katherine Baldwin is a 38 y.o. female with no personal history of cancer.  She lost her father this past August to cancer.  He had a history of breast, prostate, gallbladder and stomach cancer.  Her sister reportedly has had genetic testing but does not have her test results back yet.  Her father was not tested prior to his death.  Past Medical History  Diagnosis Date  . Trichomonas   . Chicken pox   . Abnormal Pap smear 1998    no treatment required  . Morbid obesity with body mass index of 60.0-69.9 in adult   . Bronchitis     hx of  . Multiple allergies   . Headache(784.0)     migraines  . GERD (gastroesophageal reflux disease)     mild  . Arthritis     knees  . Orbital pseudotumor   . Family history of anesthesia complication     N/V, extreme chills    Past Surgical History  Procedure Laterality Date  . Spinal tap    . No past surgeries    . Laparoscopic gastric sleeve resection N/A 08/31/2012    Procedure: LAPAROSCOPIC GASTRIC SLEEVE RESECTION;  Surgeon: Madilyn Hook, DO;  Location: [facility];  Service: General;  Laterality: N/A;  Laparoscopic Sleeve Gastrectomy with EGD    History   Social History  . Marital Status: Single    Spouse Name: N/A    Number of Children: 0  . Years of Education: 16   Occupational History  . teacher [employer]   Social History Main Topics  . Smoking status: Former Smoker -- 0.50 packs/day for 10 years    Types: Cigarettes    Quit date: 03/10/2010  . Smokeless tobacco: Never Used  .  Alcohol Use: 0.0 oz/week    0 Not specified per week     Comment: occasional  . Drug Use: No  . Sexual Activity:    Partners: Male    Birth Control/ Protection: None   Other Topics Concern  . None   Social History Narrative    REPRODUCTIVE HISTORY AND PERSONAL RISK ASSESSMENT FACTORS: Menarche was at age 44.   premenopausal Uterus Intact: yes Ovaries Intact: yes [id number], first live birth at age N/A  She has not previously undergone treatment for infertility.   Oral Contraceptive use: 4 years   She has not used HRT in the past.    FAMILY HISTORY:  We obtained a detailed, 4-generation family history.  Significant diagnoses are listed below: Family History  Problem Relation Age of Onset  . Diabetes Paternal Grandfather   . Diabetes Paternal Grandmother   . Diabetes Maternal Grandmother   . Diabetes Maternal Grandfather   . Breast cancer Father 2  . Arthritis Father     rhematoid  . Diabetes Father     Prediabetes  . Prostate cancer Father 52  . Stomach cancer Father 53  . Cancer - Other Father 72    gallbladder  . Diabetes Mother   . Asthma Mother   . Hypertension  Mother   . Fibroids Sister   . Thyroid disease Sister     Patient's maternal ancestors are of [place] and [place] descent, and paternal ancestors are of African American descent. There is no reported Ashkenazi Jewish ancestry. There is no known consanguinity.  GENETIC COUNSELING ASSESSMENT: Katherine Baldwin is a 38 y.o. female with a family history of female breast cancer, prostate cancer, gallbladder cancer and stomach cancer which somewhat suggestive of a hereditary cancer syndrome and predisposition to cancer. We, therefore, discussed and recommended the following at today's visit.   DISCUSSION: We reviewed the characteristics, features and inheritance patterns of hereditary cancer syndromes. We also discussed genetic testing, including the appropriate family members to test, the process of  testing, insurance coverage and turn-around-time for results. We discussed that approximately 2/3 of men with breast cancer have a hereditary cancer syndrome, most commonly associated with BRCA mutations.  We also discussed an increased risk for PALB2 and CHEK2 mutations in female breast cancer.  Female breast cancer is relatively rare, and therefore there could be an increased risk for other gene mutations that we are not aware of at this time.  We reviewed that her sister is currently undergoing testing.  We could either pursue genetic testing in [name], or we could wait until her sisters results are back.  IF her sister is positive for a mutation, then we would test [name] for the mutation found in her sister.  If her sister is negative, then we could pursue genetic testing as there is still a risk that [name] could test positive for a genetic mutation that her sister is negative for.    The patient is getting screened for ovarian cancer based on her family history of female breast cancer.  We discussed performing additional testing based on a risk for ovarian cancer - not based on family history but to provide some reassurance based on the fact that she is screening for ovarian cancer.  PLAN: After considering the risks, benefits, and limitations, Katherine Baldwin provided informed consent to pursue genetic testing and the blood sample will be sent to [facility] for analysis of the [facility]va[facility] Panel. We discussed the implications of a positive, negative and/ or variant of uncertain significance genetic test result. Results should be available within approximately 2-4 weeks' time, at which point they will be disclosed by telephone to Edd Arbour, as will any additional recommendations warranted by these results. Katherine Baldwin will receive a summary of her genetic counseling visit and a copy of her results once available. This information will also be available in Epic.  We encouraged [name] to remain in contact with cancer genetics annually so that we can continuously update the family history and inform her of any changes in cancer genetics and testing that may be of benefit for her family. Shewanda Sharpe Juba's questions were answered to her satisfaction today. Our contact information was provided should additional questions or concerns arise.  The patient was seen for a total of 60 minutes, greater than 50% of which was spent face-to-face counseling.  This note will also be sent to the referring provider via the electronic medical record. The patient will be supplied with a summary of this genetic counseling discussion as well as educational information on the discussed hereditary cancer syndromes following the conclusion of their visit.    _______________________________________________________________________ For Office Staff:  Number of people involved in session: 1 Was an Intern/ student  involved with case: no

## 2014-02-13 ENCOUNTER — Telehealth: Payer: Self-pay | Admitting: Obstetrics and Gynecology

## 2014-02-13 NOTE — Telephone Encounter (Signed)
Spoke with patient. Patient has taken several pregnancy tests over the weekend and would like to come in for appointment to confirm pregnancy. LMP 01/12/14. Offered 3:30pm appointment on 12/9 with Dr.Silva but patient declines. Would like to be seen sooner.Appointment scheduled for tomorrow at 9:15am with Verner Choleborah S. Leonard CNM. Patient is agreeable to date and time.  Routing to provider for final review. Patient agreeable to disposition. Will close encounter

## 2014-02-13 NOTE — Telephone Encounter (Signed)
Patient calling requesting to speak with nurse about how to proceed. She took several positive urine pregnancy tests over the weekend. LMP: 01/12/14. She cancelled her AEX for 06/26/14 with Dr. Edward JollySilva.

## 2014-02-14 ENCOUNTER — Ambulatory Visit (INDEPENDENT_AMBULATORY_CARE_PROVIDER_SITE_OTHER): Payer: BC Managed Care – PPO | Admitting: Certified Nurse Midwife

## 2014-02-14 ENCOUNTER — Encounter: Payer: Self-pay | Admitting: Certified Nurse Midwife

## 2014-02-14 VITALS — BP 102/62 | HR 64 | Resp 16 | Ht 65.75 in | Wt 268.0 lb

## 2014-02-14 DIAGNOSIS — N912 Amenorrhea, unspecified: Secondary | ICD-10-CM

## 2014-02-14 DIAGNOSIS — Z3201 Encounter for pregnancy test, result positive: Secondary | ICD-10-CM

## 2014-02-14 LAB — POCT URINE PREGNANCY: PREG TEST UR: POSITIVE

## 2014-02-14 NOTE — Progress Notes (Signed)
38 y.o. married female g0p0 here with complaint of, amenorrhea.Katherine Baldwin.LMP 01/12/14 with no spotting or bleeding since that time and positive UPT at home.  Planned pregnancy. Positive UPT here. Patient taking daily prenatal vitamins. Denies alcohol or tobacco use. Patient has stopped Prilosec now. Continues to have some digestive issues, but eating normal small frequent meal diet she has been doing since her Laparoscopic gastric sleeve resection. Total weight so far 160 pounds. Patient complaining of breast tenderness and slight fatigue. Very excited!! Would like to have PUS here for viability. Has OB referral list for area providers and plans to review their profiles and then decide on provider. Spouse excited also and supportive.  O:Healthy female WDWN Affect: normal, orientation x 3  A: Amenorrhea with positive UPT here Planned pregnancy at 4 wk 4 days per [facility] Morbid obesity with gastric sleeve resection, continues with weight loss   P: Discussed importance of prenatal care and AMA and options for genetic testing available once she establishes care with OB. PUS can be scheduled at 6+ weeks for viability. Patient will be called with insurance information and scheduled. Discussed importance of prenatal vitamins with higher folic acid than what she is taking. She will check with bariatric pharmacy for chewable type for absorption with 1 mg of Folic acid. Declines RX. Also has set up nutritional assessment for pregnancy with with same clinic,they make sure her intake is adequate with out over eating.Discussed warning signs and symptoms of early pregnancy and need to call if occurs. Comfort measures for digestion changes given with OTC papaya enzyme tablets or ginger. Questions addressed of foods to avoid during pregnancy. Other questions of pregnancy expectations addressed at length. Will advise when she sets up OB appointment. Wished well with pregnancy.  Rv as above for PUS,  prn  40 minutes spent with patient in face to face counseling regarding pregnancy and prenatal care.

## 2014-02-14 NOTE — Patient Instructions (Signed)
Prenatal Care  WHAT IS PRENATAL CARE?  Prenatal care means health care during your pregnancy, before your baby is born. It is very important to take care of yourself and your baby during your pregnancy by:   Getting early prenatal care. If you know you are pregnant, or think you might be pregnant, call your health care provider as soon as possible. Schedule a visit for a prenatal exam.  Getting regular prenatal care. Follow your health care provider's schedule for blood and other necessary tests. Do not miss appointments.  Doing everything you can to keep yourself and your baby healthy during your pregnancy.  Getting complete care. Prenatal care should include evaluation of the medical, dietary, educational, psychological, and social needs of you and your significant other. The medical and genetic history of your family and the family of your baby's father should be discussed with your health care provider.  Discussing with your health care provider:  Prescription, over-the-counter, and herbal medicines that you take.  Any history of substance abuse, alcohol use, smoking, and illegal drug use.  Any history of domestic abuse and violence.  Immunizations you have received.  Your nutrition and diet.  The amount of exercise you do.  Any environmental and occupational hazards to which you are exposed.  History of sexually transmitted infections for both you and your partner.  Previous pregnancies you have had. WHY IS PRENATAL CARE SO IMPORTANT?  By regularly seeing your health care provider, you help ensure that problems can be identified early so that they can be treated as soon as possible. Other problems might be prevented. Many studies have shown that early and regular prenatal care is important for the health of mothers and their babies.  HOW CAN I TAKE CARE OF MYSELF WHILE I AM PREGNANT?  Here are ways to take care of yourself and your baby:   Start or continue taking your  multivitamin with 400 micrograms (mcg) of folic acid every day.  Get early and regular prenatal care. It is very important to see a health care provider during your pregnancy. Your health care provider will check at each visit to make sure that you and your baby are healthy. If there are any problems, action can be taken right away to help you and your baby.  Eat a healthy diet that includes:  Fruits.  Vegetables.  Foods low in saturated fat.  Whole grains.  Calcium-rich foods, such as milk, yogurt, and hard cheeses.  Drink 6-8 glasses of liquids a day.  Unless your health care provider tells you not to, try to be physically active for 30 minutes, most days of the week. If you are pressed for time, you can get your activity in through 10-minute segments, three times a day.  Do not smoke, drink alcohol, or use drugs. These can cause long-term damage to your baby. Talk with your health care provider about steps to take to stop smoking. Talk with a member of your faith community, a counselor, a trusted friend, or your health care provider if you are concerned about your alcohol or drug use.  Ask your health care provider before taking any medicine, even over-the-counter medicines. Some medicines are not safe to take during pregnancy.  Get plenty of rest and sleep.  Avoid hot tubs and saunas during pregnancy.  Do not have X-rays taken unless absolutely necessary and with the recommendation of your health care provider. A lead shield can be placed on your abdomen to protect your baby when   X-rays are taken in other parts of your body.  Do not empty the cat litter when you are pregnant. It may contain a parasite that causes an infection called toxoplasmosis, which can cause birth defects. Also, use gloves when working in garden areas used by cats.  Do not eat uncooked or undercooked meats or fish.  Do not eat soft, mold-ripened cheeses (Brie, Camembert, and chevre) or soft, blue-veined  cheese (Danish blue and Roquefort).  Stay away from toxic chemicals like:  Insecticides.  Solvents (some cleaners or paint thinners).  Lead.  Mercury.  Sexual intercourse may continue until the end of the pregnancy, unless you have a medical problem or there is a problem with the pregnancy and your health care provider tells you not to.  Do not wear high-heel shoes, especially during the second half of the pregnancy. You can lose your balance and fall.  Do not take long trips, unless absolutely necessary. Be sure to see your health care provider before going on the trip.  Do not sit in one position for more than 2 hours when on a trip.  Take a copy of your medical records when going on a trip. Know where a hospital is located in the city you are visiting, in case of an emergency.  Most dangerous household products will have pregnancy warnings on their labels. Ask your health care provider about products if you are unsure.  Limit or eliminate your caffeine intake from coffee, tea, sodas, medicines, and chocolate.  Many women continue working through pregnancy. Staying active might help you stay healthier. If you have a question about the safety or the hours you work at your particular job, talk with your health care provider.  Get informed:  Read books.  Watch videos.  Go to childbirth classes for you and your significant other.  Talk with experienced moms.  Ask your health care provider about childbirth education classes for you and your partner. Classes can help you and your partner prepare for the birth of your baby.  Ask about a baby doctor (pediatrician) and methods and pain medicine for labor, delivery, and possible cesarean delivery. HOW OFTEN SHOULD I SEE MY HEALTH CARE PROVIDER DURING PREGNANCY?  Your health care provider will give you a schedule for your prenatal visits. You will have visits more often as you get closer to the end of your pregnancy. An average  pregnancy lasts about 40 weeks.  A typical schedule includes visiting your health care provider:   About once each month during your first 6 months of pregnancy.  Every 2 weeks during the next 2 months.  Weekly in the last month, until the delivery date. Your health care provider will probably want to see you more often if:  You are older than 35 years.  Your pregnancy is high risk because you have certain health problems or problems with the pregnancy, such as:  Diabetes.  High blood pressure.  The baby is not growing on schedule, according to the dates of the pregnancy. Your health care provider will do special tests to make sure you and your baby are not having any serious problems. WHAT HAPPENS DURING PRENATAL VISITS?   At your first prenatal visit, your health care provider will do a physical exam and talk to you about your health history and the health history of your partner and your family. Your health care provider will be able to tell you what date to expect your baby to be born on.  Your   first physical exam will include checks of your blood pressure, measurements of your height and weight, and an exam of your pelvic organs. Your health care provider will do a Pap test if you have not had one recently and will do cultures of your cervix to make sure there is no infection.  At each prenatal visit, there will be tests of your blood, urine, blood pressure, weight, and the progress of the baby will be checked.  At your later prenatal visits, your health care provider will check how you are doing and how your baby is developing. You may have a number of tests done as your pregnancy progresses.  Ultrasound exams are often used to check on your baby's growth and health.  You may have more urine and blood tests, as well as special tests, if needed. These may include amniocentesis to examine fluid in the pregnancy sac, stress tests to check how the baby responds to contractions, or a  biophysical profile to measure your baby's well-being. Your health care provider will explain the tests and why they are necessary.  You should be tested for high blood sugar (gestational diabetes) between the 24th and 28th weeks of your pregnancy.  You should discuss with your health care provider your plans to breastfeed or bottle-feed your baby.  Each visit is also a chance for you to learn about staying healthy during pregnancy and to ask questions. Document Released: 02/27/2003 Document Revised: 03/01/2013 Document Reviewed: 05/11/2013 ExitCare Patient Information 2015 [employer]. This information is not intended to replace advice given to you by your health care provider. Make sure you discuss any questions you have with your health care provider.  

## 2014-02-15 NOTE — Progress Notes (Signed)
Reviewed personally.  M. Suzanne Earnstine Meinders, MD.  

## 2014-02-17 ENCOUNTER — Telehealth: Payer: Self-pay | Admitting: Certified Nurse Midwife

## 2014-02-17 DIAGNOSIS — O3680X0 Pregnancy with inconclusive fetal viability, not applicable or unspecified: Secondary | ICD-10-CM

## 2014-02-17 NOTE — Telephone Encounter (Signed)
Returned a call to [place].

## 2014-02-17 NOTE — Telephone Encounter (Signed)
Left message for patient to call back. Need to go over benefits and schedule PUS Pr $39

## 2014-02-20 NOTE — Telephone Encounter (Signed)
Left message for patient to call back  

## 2014-02-20 NOTE — Telephone Encounter (Signed)
Returning call and will be available after 4:30 PM today.

## 2014-02-22 ENCOUNTER — Encounter: Payer: Self-pay | Admitting: Genetic Counselor

## 2014-02-22 DIAGNOSIS — Z1379 Encounter for other screening for genetic and chromosomal anomalies: Secondary | ICD-10-CM | POA: Insufficient documentation

## 2014-02-22 HISTORY — DX: Encounter for other screening for genetic and chromosomal anomalies: Z13.79

## 2014-02-24 ENCOUNTER — Telehealth: Payer: Self-pay | Admitting: Genetic Counselor

## 2014-02-24 NOTE — Telephone Encounter (Signed)
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  Discussed briefly that this 1) does NOT convey as high of a risk for breast cancer as BRCA mutations, 2) we were most concerned about BRCA mutations based on her dad's breast cancer and she does NOT have a BRCA mutation, 3) this is a newer, less studied gene, so not as much information is known,but that we are not aware that mutations in this gene cause female breast cancer.  Katherine Baldwin understands this information and claims that she is not very upset about this as she knew that this was a possibility.  She revealed that she just found out that she was pregnant.  Congratulations were relayed.  [name] scheduled a results appointment for Monday, Dec. 21 at 3 PM.

## 2014-02-27 ENCOUNTER — Ambulatory Visit (HOSPITAL_BASED_OUTPATIENT_CLINIC_OR_DEPARTMENT_OTHER): Payer: BC Managed Care – PPO | Admitting: Genetic Counselor

## 2014-02-27 ENCOUNTER — Encounter: Payer: Self-pay | Admitting: Genetic Counselor

## 2014-02-27 DIAGNOSIS — Z808 Family history of malignant neoplasm of other organs or systems: Secondary | ICD-10-CM

## 2014-02-27 DIAGNOSIS — Z803 Family history of malignant neoplasm of breast: Secondary | ICD-10-CM

## 2014-02-27 NOTE — Progress Notes (Addendum)
REFERRING PROVIDER: Thressa Sheller, MD [address], [place], [place] [address]  PRIMARY PROVIDER:  Thressa Sheller, MD  PRIMARY REASON FOR VISIT:  1. Family history of breast cancer in female      HISTORY OF PRESENT ILLNESS:   Ms. Katherine Baldwin, a 38 y.o. female, was seen for a [facility] cancer genetics consultation at the request of Dr. Quincy Simmonds due to a family history of cancer.  Ms. Vanderploeg presents to clinic today to discuss the possibility of a hereditary predisposition to cancer, genetic testing, and to further clarify her future cancer risks, as well as potential cancer risks for family members.   CANCER HISTORY:   No history exists.     HISTORY OF PRESENT ILLNESS: Ms. Stradley is a 38 y.o. female with no personal history of cancer.     Past Medical History  Diagnosis Date  . Trichomonas   . Chicken pox   . Abnormal Pap smear 1998    no treatment required  . Morbid obesity with body mass index of 60.0-69.9 in adult   . Bronchitis     hx of  . Multiple allergies   . Headache(784.0)     migraines  . GERD (gastroesophageal reflux disease)     mild  . Arthritis     knees  . Orbital pseudotumor   . Family history of anesthesia complication     N/V, extreme chills    Past Surgical History  Procedure Laterality Date  . Spinal tap    . No past surgeries    . Laparoscopic gastric sleeve resection N/A 08/31/2012    Procedure: LAPAROSCOPIC GASTRIC SLEEVE RESECTION;  Surgeon: Madilyn Hook, DO;  Location: [facility];  Service: General;  Laterality: N/A;  Laparoscopic Sleeve Gastrectomy with EGD    History   Social History  . Marital Status: Single    Spouse Name: N/A    Number of Children: 0  . Years of Education: 16   Occupational History  . teacher [employer]   Social History Main Topics  . Smoking status: Former Smoker -- 0.50 packs/day for 10 years    Types: Cigarettes    Quit date: 03/10/2010  . Smokeless tobacco: Never Used  . Alcohol  Use: No  . Drug Use: No  . Sexual Activity:    Partners: Male    Birth Control/ Protection: None   Other Topics Concern  . None   Social History Narrative     FAMILY HISTORY:  We obtained a detailed, 4-generation family history.  Significant diagnoses are listed below: Family History  Problem Relation Age of Onset  . Diabetes Paternal Grandfather   . Diabetes Paternal Grandmother   . Diabetes Maternal Grandmother   . Diabetes Maternal Grandfather   . Breast cancer Father 6  . Arthritis Father     rhematoid  . Diabetes Father     Prediabetes  . Prostate cancer Father 9  . Stomach cancer Father 2  . Cancer - Other Father 42    gallbladder  . Diabetes Mother   . Asthma Mother   . Hypertension Mother   . Fibroids Sister   . Thyroid disease Sister    Patient's maternal ancestors are of African American descent, and paternal ancestors are of African American descent. There is no reported Ashkenazi Jewish ancestry. There is no known consanguinity.  GENETIC COUNSELING ASSESSMENT: FAVOR KREH is a 38 y.o. female with a family history of female breast cancer and a known family mutation in  RAD50. 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 At this time, it is unknown if this variant is associated with increased cancer risk or if this is a normal finding, but most variants such as this get reclassified to being inconsequential. It should not be used to make medical management decisions. With time, we suspect the lab will determine the significance of this variant, if any. If we do learn more about it, we will try to contact Ms. Penning to discuss it further. However, it is important to stay in touch with [place] periodically and keep the address and phone number up to date.  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  This is driven by [facility] internal data.  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 The updated report date is January 06, 2020. The updated report date is October 30, 2018.  We, therefore, discussed and recommended the following at today's visit.   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 Data also associate the RAD50 gene with ovarian cancer; however, these data are limited (PMID: 03474259). An elevated risk for other cancers has been considered, but available evidence is insufficient to make a determination at this time (PMID: 56387564, 33295188, 41660630). While an individual with a RAD50 pathogenic variant will not necessarily develop cancer in her lifetime, her cancer risk is increased when compared to the general population risk.  Inheritance Individuals with a single pathogenic variant in RAD50 have a 50% chance of passing that variant on to their offspring. It is now possible to identify relatives who can pursue testing for this specific familial variant. Many cases are inherited from a parent, but some cases occur spontaneously (i.e., an individual with a pathogenic variant has parents who do not have it).  Limited evidence also supports a correlation between the RAD50 gene and autosomal recessive Nijmegen breakage syndrome-like disorder (NBSLD). NBSLD is characterized by a clinical phenotype resembling Nijmegen breakage syndrome including chromosomal instability, radiosensitivity, neurodevelopmental disease, and immunodeficiency, but with a milder clinical presentation (PMID: 16010932).  Management At this time, there are no published guidelines or recommendations suggesting RAD50-specific medical management. However, due to the associated increased risk of breast cancer in women with a pathogenic RAD50 variant, enhanced or more frequent cancer screening may be  warranted. An individual's cancer risk and medical management are not determined by genetic test results alone. Overall cancer risk assessment incorporates additional factors, including personal medical history, family history, and any available genetic information that may result in a personalized plan for cancer prevention and surveillance.  Even though data regarding pathogenic RAD50 are limited and the associated risk of breast and ovarian cancer is currently emerging, knowing if a pathogenic variant is present is advantageous. At-risk relatives can be identified, enabling pursuit of a diagnostic evaluation. Further, the available information regarding hereditary cancer susceptibility genes is constantly evolving and more clinically relevant data regarding RAD50 are likely to become available in the near future. Awareness of this cancer predisposition encourages patients and their providers to inform at-risk family members, diligently follow standard screening protocols, and be vigilant in maintaining close and regular contact with their local genetics clinic in anticipation of new information.   Lastly, we encouraged Ms. Younis to remain in contact with cancer genetics annually so that  we can continuously update the family history and inform her of any changes in cancer genetics and testing that may be of benefit for this family.   Ms.  Pates questions were answered to her satisfaction today. Our contact information was provided should additional questions or concerns arise. Thank you for the referral and allowing [place] to share in the care of your patient.   Mervin Ramires P. Florene Glen, [place], [facility] Certified Genetic Counselor [email or web address] phone: [phone]  The patient was seen for a total of 20 minutes in face-to-face genetic counseling.  This patient was discussed with Drs. Magrinat, Lindi Adie and/or Burr Medico who agrees with the above.     _______________________________________________________________________ For Office Staff:  Number of people involved in session: 1 Was an Intern/ student involved with case: no

## 2014-03-06 ENCOUNTER — Inpatient Hospital Stay (HOSPITAL_COMMUNITY): Payer: BC Managed Care – PPO

## 2014-03-06 ENCOUNTER — Inpatient Hospital Stay (HOSPITAL_COMMUNITY)
Admission: AD | Admit: 2014-03-06 | Discharge: 2014-03-06 | Disposition: A | Discharge status: Home / Self Care | Payer: BC Managed Care – PPO | Source: Ambulatory Visit | Attending: Obstetrics and Gynecology | Admitting: Obstetrics and Gynecology

## 2014-03-06 ENCOUNTER — Encounter (HOSPITAL_COMMUNITY): Payer: Self-pay | Admitting: *Deleted

## 2014-03-06 DIAGNOSIS — R1031 Right lower quadrant pain: Secondary | ICD-10-CM | POA: Insufficient documentation

## 2014-03-06 DIAGNOSIS — R102 Pelvic and perineal pain: Secondary | ICD-10-CM | POA: Diagnosis not present

## 2014-03-06 DIAGNOSIS — Z87891 Personal history of nicotine dependence: Secondary | ICD-10-CM | POA: Diagnosis not present

## 2014-03-06 DIAGNOSIS — O99211 Obesity complicating pregnancy, first trimester: Secondary | ICD-10-CM | POA: Insufficient documentation

## 2014-03-06 DIAGNOSIS — Z3A01 Less than 8 weeks gestation of pregnancy: Secondary | ICD-10-CM

## 2014-03-06 DIAGNOSIS — Z6841 Body Mass Index (BMI) 40.0 and over, adult: Secondary | ICD-10-CM | POA: Diagnosis not present

## 2014-03-06 DIAGNOSIS — O9989 Other specified diseases and conditions complicating pregnancy, childbirth and the puerperium: Secondary | ICD-10-CM | POA: Diagnosis not present

## 2014-03-06 DIAGNOSIS — K219 Gastro-esophageal reflux disease without esophagitis: Secondary | ICD-10-CM | POA: Diagnosis not present

## 2014-03-06 LAB — URINALYSIS, ROUTINE W REFLEX MICROSCOPIC
Bilirubin Urine: NEGATIVE
Glucose, UA: NEGATIVE mg/dL
Hgb urine dipstick: NEGATIVE
Ketones, ur: NEGATIVE mg/dL
LEUKOCYTES UA: NEGATIVE
NITRITE: NEGATIVE
PROTEIN: NEGATIVE mg/dL
Specific Gravity, Urine: 1.02 (ref 1.005–1.030)
Urobilinogen, UA: 0.2 mg/dL (ref 0.0–1.0)
pH: 6 (ref 5.0–8.0)

## 2014-03-06 LAB — CBC
HCT: 36 % (ref 36.0–46.0)
Hemoglobin: 12.4 g/dL (ref 12.0–15.0)
MCH: 29.7 pg (ref 26.0–34.0)
MCHC: 34.4 g/dL (ref 30.0–36.0)
MCV: 86.1 fL (ref 78.0–100.0)
PLATELETS: 192 10*3/uL (ref 150–400)
RBC: 4.18 MIL/uL (ref 3.87–5.11)
RDW: 12.9 % (ref 11.5–15.5)
WBC: 9.6 10*3/uL (ref 4.0–10.5)

## 2014-03-06 LAB — HCG, QUANTITATIVE, PREGNANCY: hCG, Beta Chain, Quant, S: 69296 m[IU]/mL — ABNORMAL HIGH (ref <5)

## 2014-03-06 NOTE — Telephone Encounter (Signed)
Spoke with patient. Patient states that she is pregnant and has been having cramping on her right side since 11am this morning. Denies any bleeding. "It feels like when I have had gas before but I have gone to the bathroom twice today and it has not gone away. It is not getting worse but is not getting better." Denies vomiting, fever, chills, or back pain. Has been nauseous for two weeks with heartburn. Patient had gastric bypass and was taking omeprazole daily before pregnancy. "My heartburn feels like before I was pregnant. I don't know if the nausea is coming from that or not." Advised patient will send a message to covering provider and return call with further recommendations and instructions. Patient is agreeable. Patient has viability PUS in office on 12/31 with Dr.Silva.

## 2014-03-06 NOTE — Telephone Encounter (Signed)
Spoke with patient. Advised patient that I spoke with Dr.Silva who recommends that [facility] due to cramping on right side. Advised will need ultrasound earlier than 12/31 to rule out ectopic pregnancy. Patient is agreeable. [facility].

## 2014-03-06 NOTE — MAU Note (Signed)
PT SAYS  SHE STARTED  FEELING  CRAMPING ON  HER RIGHT   SIDE AT 10 AM    -  THOUGHT  IT WAS GAS.    SHE CALLED  OFFICE   TO SEE IF  SHE COULD  TAKE  TYLENOL-    ALSO FEELS HEARTBURN-  BUT HAS HAD SINCE GASTRIC  SLEEVE-  1.6  MTHS  AGO-  HAS BEEN  TAKING  MEDS-    WITH   RELIEF-   BUT  NOW  DOESN'T   WORK.         WAS IN OFFICE - CONFIRM  PREG-      [id number].     NAUSEA STARTED   ON Friday-  NO VOMITING.

## 2014-03-06 NOTE — MAU Provider Note (Signed)
None     Chief Complaint:   Katherine Baldwin is  38 y.o. [name] at 3577w4d presents complaining of RLQ cramping all day.  Thought it was gas.  Has had 2 BM's and taken OTC meds for gas, but is still cramping.  Advised by OB to come for US to RO ectopic pregnancy. Has had some nausea, no vomiting or anorexia.    Obstetrical/Gynecological History: OB History    Gravida Para Term Preterm AB TAB SAB Ectopic Multiple Living   1 0 0 0 0 0 0 0 0 0      Past Medical History: Past Medical History  Diagnosis Date  . Trichomonas   . Chicken pox   . Abnormal Pap smear 1998    no treatment required  . Morbid obesity with body mass index of 60.0-69.9 in adult   . Bronchitis     hx of  . Multiple allergies   . Headache(784.0)     migraines  . GERD (gastroesophageal reflux disease)     mild  . Arthritis     knees  . Orbital pseudotumor   . Family history of anesthesia complication     N/V, extreme chills    Past Surgical History: Past Surgical History  Procedure Laterality Date  . Spinal tap    . Laparoscopic gastric sleeve resection N/A 08/31/2012    Procedure: LAPAROSCOPIC GASTRIC SLEEVE RESECTION;  Surgeon: Lodema PilotBrian Layton, DO;  Location: [facility];  Service: General;  Laterality: N/A;  Laparoscopic Sleeve Gastrectomy with EGD    Family History: Family History  Problem Relation Age of Onset  . Diabetes Paternal Grandfather   . Diabetes Paternal Grandmother   . Diabetes Maternal Grandmother   . Diabetes Maternal Grandfather   . Breast cancer Father [id number]  . Arthritis Father     rhematoid  . Diabetes Father     Prediabetes  . Prostate cancer Father 968  . Stomach cancer Father 6171  . Cancer - Other Father 6772    gallbladder  . Diabetes Mother   . Asthma Mother   . Hypertension Mother   . Fibroids Sister   . Thyroid disease Sister     Social History: History  Substance Use Topics  . Smoking status: Former Smoker -- 0.50 packs/day for 10 years    Types: Cigarettes    Quit  date: 03/10/2010  . Smokeless tobacco: Never Used  . Alcohol Use: No    Allergies:  Allergies  Allergen Reactions  . Tree Extract Itching  . Fruit & Vegetable Daily [Nutritional Supplements]     Fruits and uncooked vegetables-itchy mouth and throat. Patient reports this is transient and she never knows which foods will have a rxn  . Peanuts [Peanut Oil] Itching    Itching in mouth    Meds:  Prescriptions prior to admission  Medication Sig Dispense Refill Last Dose  . calcium carbonate (TUMS - DOSED IN MG ELEMENTAL CALCIUM) 500 MG chewable tablet Chew 1-2 tablets by mouth as needed for indigestion or heartburn.   03/06/2014 at Unknown time  . Cyanocobalamin (VITAMIN B 12 PO) Take 1 tablet by mouth daily.    Past Week at Unknown time  . Digestive Enzymes (PAPAYA AND ENZYMES PO) Take 1 tablet by mouth daily.   Past Month at Unknown time  . Prenatal Vit-Fe Fumarate-FA (PRENATAL MULTIVITAMIN) TABS tablet Take 1 tablet by mouth daily at 12 noon.   03/05/2014 at Unknown time    Review of Systems   Constitutional: Negative  for fever and chills Eyes: Negative for visual disturbances Respiratory: Negative for shortness of breath, dyspnea Cardiovascular: Negative for chest pain or palpitations  Gastrointestinal: Negative for vomiting, diarrhea and constipation Genitourinary: Negative for dysuria and urgency Musculoskeletal: Negative for back pain, joint pain, myalgias  Neurological: Negative for dizziness and headaches     Physical Exam  Blood pressure 120/66, pulse 82, temperature 98.6 F (37 C), temperature source Oral, resp. rate 20, height 5\' 4"  (1.626 m), weight 124.342 kg (274 lb 2 oz), last menstrual period 01/12/2014. GENERAL: Well-developed, well-nourished female in no acute distress.  LUNGS: Clear to auscultation bilaterally.  HEART: Regular rate and rhythm. ABDOMEN: Soft, nontender, nondistended.  No guarding or rebound tenderness EXTREMITIES: Nontender, no edema, 2+  distal pulses. DTR's 2+ Labs: No results found for this or any previous visit (from the past 24 hour(s)). Imaging Studies:  US shows a 7.5 week IUP with FCA.  Small myoma, normal ovaries  Assessment: Katherine Baldwin is  38 y.o. [name] at 1764w4d presents with viable IUP.  Plan: Discussed with Dr. Edward JollySilva [facility]   CRESENZO-DISHMAN,Agapita Savarino 12/28/20157:51 PM

## 2014-03-06 NOTE — Telephone Encounter (Signed)
Pt is pregnant and having some really bad cramping she wants to know if she can take some tylenol

## 2014-03-06 NOTE — Telephone Encounter (Signed)
I [facility] for [facility] and pelvic ultrasound due to her unilateral pain.  She may take Tums for dyspepsia.   Cc- Leda QuailSuzanne Miller, MD

## 2014-03-07 NOTE — Telephone Encounter (Signed)
Dr.Silva, would you like patient to keep appointment for ultrasound on Thursday 12/31? [facility]. Please advise.

## 2014-03-07 NOTE — Telephone Encounter (Signed)
No ultrasound follow up is needed in our office. Patient had a viable IUP on ultrasound! She also had a very small fibroid of the uterus (not the cause of her pain.) Our congratulations on her pregnancy!  She will need to establish care with an OB provider in the community.  Please send her a list if she has not already chosen a group.   Cc- Sara Chuebbie Leonard

## 2014-03-08 NOTE — Telephone Encounter (Signed)
Spoke with patient. Advised patient of message as seen below from Dr.Silva. Patient is agreeable. Appointment for viability ultrasound cancelled here at our office. Patient states that she has the list of providers and practices we recommend. Will call to schedule appointment.   Cc: Katherine Baldwin no late cancellation fee to be applied as patient needed [facility]  Routing to provider for final review. Patient agreeable to disposition. Will close encounter

## 2014-03-09 ENCOUNTER — Other Ambulatory Visit: Payer: BC Managed Care – PPO | Admitting: Obstetrics and Gynecology

## 2014-03-09 ENCOUNTER — Other Ambulatory Visit: Payer: BC Managed Care – PPO

## 2014-03-10 NOTE — L&D Delivery Note (Signed)
Delivery Note  Patient called out to nursing station w c/o baby in bed. She was 2 cm 2 hours prior.  At 9:28 AM a viable female was delivered via Vaginal, Spontaneous Delivery (Presentation:vertex).  Upon my arrival 2 minutes later, baby was skin to skin w mom.   APGAR: 8, 9; weight 5 lb 14.7 oz (2685 g).   Placenta status: Intact, Spontaneous.  Cord: 3 vessels with the following complications: None.  Cord pH: n/a  Anesthesia: Epidural  Episiotomy: None Lacerations: Sulcus;1st degree;Perineal Suture Repair: 3.0 vicryl rapide Est. Blood Loss (mL): 100  Mom to postpartum.  Baby to Couplet care / Skin to Skin.  Katherine Baldwin GEFFEL Katherine Baldwin 10/04/2014, 11:10 AM

## 2014-03-14 ENCOUNTER — Telehealth: Payer: Self-pay | Admitting: Obstetrics and Gynecology

## 2014-03-14 NOTE — Telephone Encounter (Signed)
Patient is asking if she could wait until March to have her follow up care with Dr.Cousins. Patient will have new insurance in March and will have a much lower deductible. Patient is asking if she could see Dr.Silva for emergences only until March?

## 2014-03-14 NOTE — Telephone Encounter (Signed)
Left message to call Johnsie Moscoso at [phone]. 

## 2014-03-14 NOTE — Telephone Encounter (Signed)
Pt is requesting her records to be sent to Dr. Cherly Hensenousins [phone] Proof of Pregnancy and her ultrasound.we have on file from the ER.

## 2014-03-16 NOTE — Telephone Encounter (Signed)
Spoke with patient. Advised patient the doctor's in our practice are not covered with their licensure to follow care after 6 weeks of pregnancy. Advised it will be best to contact Dr.Cousins office regarding insurance concerns to see what can be done and what their recommendations are regarding how and when to schedule appointment. Advised best not to wait long for follow up care.Patient is agreeable and verbalizes understanding. Will contact their office in regards to scheduling an appointment.  Routing to provider for final review. Patient agreeable to disposition. Will close encounter

## 2014-04-03 ENCOUNTER — Ambulatory Visit: Payer: BC Managed Care – PPO | Admitting: Gynecology

## 2014-04-14 ENCOUNTER — Other Ambulatory Visit: Payer: Self-pay | Admitting: Obstetrics and Gynecology

## 2014-04-14 LAB — OB RESULTS CONSOLE GC/CHLAMYDIA
Chlamydia: NEGATIVE
Gonorrhea: NEGATIVE

## 2014-04-14 LAB — OB RESULTS CONSOLE ANTIBODY SCREEN: Antibody Screen: NEGATIVE

## 2014-04-14 LAB — OB RESULTS CONSOLE RPR: RPR: NONREACTIVE

## 2014-04-14 LAB — OB RESULTS CONSOLE RUBELLA ANTIBODY, IGM: Rubella: IMMUNE

## 2014-04-14 LAB — OB RESULTS CONSOLE HIV ANTIBODY (ROUTINE TESTING): HIV: NONREACTIVE

## 2014-04-14 LAB — OB RESULTS CONSOLE ABO/RH: RH Type: POSITIVE

## 2014-04-14 LAB — OB RESULTS CONSOLE HEPATITIS B SURFACE ANTIGEN: HEP B S AG: NEGATIVE

## 2014-04-23 ENCOUNTER — Ambulatory Visit (INDEPENDENT_AMBULATORY_CARE_PROVIDER_SITE_OTHER): Payer: BC Managed Care – PPO | Admitting: Family Medicine

## 2014-04-23 VITALS — BP 112/64 | HR 72 | Temp 98.2°F | Resp 19 | Ht 65.5 in | Wt 281.6 lb

## 2014-04-23 DIAGNOSIS — J0100 Acute maxillary sinusitis, unspecified: Secondary | ICD-10-CM

## 2014-04-23 DIAGNOSIS — G932 Benign intracranial hypertension: Secondary | ICD-10-CM

## 2014-04-23 DIAGNOSIS — R51 Headache: Secondary | ICD-10-CM

## 2014-04-23 DIAGNOSIS — Z3490 Encounter for supervision of normal pregnancy, unspecified, unspecified trimester: Secondary | ICD-10-CM

## 2014-04-23 DIAGNOSIS — R519 Headache, unspecified: Secondary | ICD-10-CM

## 2014-04-23 MED ORDER — AMOXICILLIN 500 MG PO CAPS: 1000.0000 mg | ORAL_CAPSULE | Freq: Two times a day (BID) | ORAL | Status: DC

## 2014-04-23 NOTE — Progress Notes (Signed)
Subjective:    Patient ID: Katherine Baldwin, female    DOB: Feb 15, 1976, 39 y.o.   MRN: [id number]  04/23/2014  Headache   HPI This 39 y.o. female presents for evaluation of headache during pregnancy.  [redacted] weeks pregnant.  Started a new job; flying for the first time; traveling a lot lately.  Onset of headache for three days.  Leaving again today for three days.  Had mild cramping last night; none in past two weeks.  Vomiting last night after eating.  Occipital headache and radiates into L parietal region.  Severity 2-5/10.  Constant headache.  Taking Tylenol for pain.  Took Mucinex yesterday to see if sinus related; not sure if Mucinex helped.  No blurred vision, diplopia.  Light-headed yesterday which was new.  No n/t.  History of headaches years ago at highest weight at 400; diagnosed with pseudotumor cerebrii; had to perform lumbar puncture.  Not sure if headache is similar to pseudotumor headache.  Last eye exam 6 months ago pre-pregnancy; optic nerve was good at that time.  No focal weakness.  With pregnancy, suffered with nausea for weeks 6-11.  Nausea has improved but can occur randomly once per week.  Last night vomited for the first time.  Vomited x 1 food contents.  No further vomiting today.  No fever/chills/sweats.  Two weeks ago, was sick; called gynecologist; recommended Mucinex; congestion was green and coughing.  S/p flu vaccine ten days ago.  No cough now; chronic rhinorrhea since pregnancy; drainage is light green and thick.  No sinus tenderness or pressure.  +PND.  Currently headache 3/10.  First pregnancy.  Sugar this morning was 77.   Review of Systems  Constitutional: Negative for fever, chills, diaphoresis and fatigue.  HENT: Positive for congestion, postnasal drip and rhinorrhea. Negative for ear pain, mouth sores, sinus pressure and sore throat.   Respiratory: Negative for cough and shortness of breath.   Gastrointestinal: Positive for nausea and vomiting. Negative for  abdominal pain and diarrhea.  Genitourinary: Negative for dysuria, urgency, frequency, hematuria, flank pain, vaginal bleeding, vaginal discharge and pelvic pain.  Skin: Negative for rash.  Neurological: Positive for dizziness and headaches. Negative for tremors, syncope, facial asymmetry, speech difficulty, weakness, light-headedness and numbness.    Past Medical History  Diagnosis Date  . Trichomonas   . Chicken pox   . Abnormal Pap smear 1998    no treatment required  . Morbid obesity with body mass index of 60.0-69.9 in adult   . Bronchitis     hx of  . Multiple allergies   . Headache(784.0)     migraines  . GERD (gastroesophageal reflux disease)     mild  . Arthritis     knees  . Orbital pseudotumor   . Family history of anesthesia complication     N/V, extreme chills   Past Surgical History  Procedure Laterality Date  . Spinal tap    . Laparoscopic gastric sleeve resection N/A 08/31/2012    Procedure: LAPAROSCOPIC GASTRIC SLEEVE RESECTION;  Surgeon: Lodema Pilot, DO;  Location: [facility];  Service: General;  Laterality: N/A;  Laparoscopic Sleeve Gastrectomy with EGD   Allergies  Allergen Reactions  . Tree Extract Itching  . Fruit & Vegetable Daily [Nutritional Supplements]     Fruits and uncooked vegetables-itchy mouth and throat. Patient reports this is transient and she never knows which foods will have a rxn  . Peanuts [Peanut Oil] Itching    Itching in mouth   Current  Outpatient Prescriptions  Medication Sig Dispense Refill  . acetaminophen (TYLENOL) 500 MG tablet Take 500 mg by mouth every 6 (six) hours as needed.    . Cyanocobalamin (VITAMIN B 12 PO) Take 1 tablet by mouth daily.     . diphenhydrAMINE (BENADRYL) 25 MG tablet Take 25 mg by mouth every 6 (six) hours as needed.    . doxylamine, Sleep, (UNISOM) 25 MG tablet Take 25 mg by mouth at bedtime as needed.    Marland Kitchen. esomeprazole (NEXIUM) 20 MG capsule Take 20 mg by mouth daily at 12 noon.    Marland Kitchen. FIBER SELECT  GUMMIES PO Take 2 capsules by mouth daily.    Marland Kitchen. FOLIC ACID PO Take 1,000 mg by mouth daily.    . Prenatal Vit-Fe Fumarate-FA (PRENATAL MULTIVITAMIN) TABS tablet Take 1 tablet by mouth daily at 12 noon. With dha and omega 3    . amoxicillin (AMOXIL) 500 MG capsule Take 2 capsules (1,000 mg total) by mouth 2 (two) times daily. 40 capsule 0  . calcium carbonate (TUMS - DOSED IN MG ELEMENTAL CALCIUM) 500 MG chewable tablet Chew 1-2 tablets by mouth as needed for indigestion or heartburn.     No current facility-administered medications for this visit.       Objective:    BP 112/64 mmHg  Pulse 72  Temp(Src) 98.2 F (36.8 C) (Oral)  Resp 19  Ht 5' 5.5" (1.664 m)  Wt 281 lb 9.6 oz (127.733 kg)  BMI 46.13 kg/m2  SpO2 100%  LMP 01/12/2014 Physical Exam  Constitutional: She is oriented to person, place, and time. She appears well-developed and well-nourished. No distress.  Obese.  HENT:  Head: Normocephalic and atraumatic.  Right Ear: External ear normal.  Left Ear: External ear normal.  Nose: Mucosal edema and rhinorrhea present. Right sinus exhibits no maxillary sinus tenderness and no frontal sinus tenderness. Left sinus exhibits no maxillary sinus tenderness and no frontal sinus tenderness.  Mouth/Throat: Mucous membranes are normal. Oropharyngeal exudate present. No posterior oropharyngeal edema, posterior oropharyngeal erythema or tonsillar abscesses.  +B nares with mild bloody drainage.  OP with drainage.  Eyes: Conjunctivae and EOM are normal. Pupils are equal, round, and reactive to light.  Neck: Normal range of motion. Neck supple.  Cardiovascular: Normal rate, regular rhythm and normal heart sounds.  Exam reveals no gallop and no friction rub.   No murmur heard. Pulmonary/Chest: Effort normal and breath sounds normal. She has no wheezes. She has no rales.  Neurological: She is alert and oriented to person, place, and time. No cranial nerve deficit. She exhibits normal muscle  tone. Coordination normal.  Skin: No rash noted. She is not diaphoretic.  Psychiatric: She has a normal mood and affect. Her behavior is normal.  Nursing note and vitals reviewed.       Assessment & Plan:   1. Acute nonintractable headache, unspecified headache type   2. Acute maxillary sinusitis, recurrence not specified   3. Pregnancy   4. Pseudotumor cerebri     1. Headache: New.  Mild headache severity 2-5/10 for past three days; normal neurological exam in office.  Continue Tylenol PRN; increase fluids for next week.  Treat for sinusitis with recent URI and frequent flying. If headaches persists, follow-up with OB/GYN and ophthalmologist.  Glucose normal at home this morning. 2.  Acute maxillary sinusitis:  New. Rx for Amoxicillin provided; recommend nasal saline spray tid.  Continue Mucinex daily. 3.  Pseudotumor Cerebri: stable; has lost 100 pounds since gastric sleeve.  Current headache milder than headaches associated with pseudotumor cerebri; follow-up with gynecology and ophthalmology if headaches persist after treatment for sinusitis. 4.  Pregnancy: IUD @ 14 weeks currently.      Meds ordered this encounter  Medications  . doxylamine, Sleep, (UNISOM) 25 MG tablet    Sig: Take 25 mg by mouth at bedtime as needed.  Marland Kitchen FOLIC ACID PO    Sig: Take 4,010 mg by mouth daily.  Marland Kitchen esomeprazole (NEXIUM) 20 MG capsule    Sig: Take 20 mg by mouth daily at 12 noon.  Marland Kitchen acetaminophen (TYLENOL) 500 MG tablet    Sig: Take 500 mg by mouth every 6 (six) hours as needed.  . diphenhydrAMINE (BENADRYL) 25 MG tablet    Sig: Take 25 mg by mouth every 6 (six) hours as needed.  Marland Kitchen FIBER SELECT GUMMIES PO    Sig: Take 2 capsules by mouth daily.  Marland Kitchen amoxicillin (AMOXIL) 500 MG capsule    Sig: Take 2 capsules (1,000 mg total) by mouth 2 (two) times daily.    Dispense:  40 capsule    Refill:  0    No Follow-up on file.     Wilson Sample Paulita Fujita, M.D. [facility] [address] [place], [place]  [address] [phone] phone [phone] fax

## 2014-04-23 NOTE — Patient Instructions (Addendum)
1.  Recommend nasal saline spray (Ocean Mist) and use three times daily.  Sinusitis Sinusitis is redness, soreness, and inflammation of the paranasal sinuses. Paranasal sinuses are air pockets within the bones of your face (beneath the eyes, the middle of the forehead, or above the eyes). In healthy paranasal sinuses, mucus is able to drain out, and air is able to circulate through them by way of your nose. However, when your paranasal sinuses are inflamed, mucus and air can become trapped. This can allow bacteria and other germs to grow and cause infection. Sinusitis can develop quickly and last only a short time (acute) or continue over a long period (chronic). Sinusitis that lasts for more than 12 weeks is considered chronic.  CAUSES  Causes of sinusitis include:  Allergies.  Structural abnormalities, such as displacement of the cartilage that separates your nostrils (deviated septum), which can decrease the air flow through your nose and sinuses and affect sinus drainage.  Functional abnormalities, such as when the small hairs (cilia) that line your sinuses and help remove mucus do not work properly or are not present. SIGNS AND SYMPTOMS  Symptoms of acute and chronic sinusitis are the same. The primary symptoms are pain and pressure around the affected sinuses. Other symptoms include:  Upper toothache.  Earache.  Headache.  Bad breath.  Decreased sense of smell and taste.  A cough, which worsens when you are lying flat.  Fatigue.  Fever.  Thick drainage from your nose, which often is green and may contain pus (purulent).  Swelling and warmth over the affected sinuses. DIAGNOSIS  Your health care provider will perform a physical exam. During the exam, your health care provider may:  Look in your nose for signs of abnormal growths in your nostrils (nasal polyps).  Tap over the affected sinus to check for signs of infection.  View the inside of your sinuses (endoscopy)  using an imaging device that has a light attached (endoscope). If your health care provider suspects that you have chronic sinusitis, one or more of the following tests may be recommended:  Allergy tests.  Nasal culture. A sample of mucus is taken from your nose, sent to a lab, and screened for bacteria.  Nasal cytology. A sample of mucus is taken from your nose and examined by your health care provider to determine if your sinusitis is related to an allergy. TREATMENT  Most cases of acute sinusitis are related to a viral infection and will resolve on their own within 10 days. Sometimes medicines are prescribed to help relieve symptoms (pain medicine, decongestants, nasal steroid sprays, or saline sprays).  However, for sinusitis related to a bacterial infection, your health care provider will prescribe antibiotic medicines. These are medicines that will help kill the bacteria causing the infection.  Rarely, sinusitis is caused by a fungal infection. In theses cases, your health care provider will prescribe antifungal medicine. For some cases of chronic sinusitis, surgery is needed. Generally, these are cases in which sinusitis recurs more than 3 times per year, despite other treatments. HOME CARE INSTRUCTIONS   Drink plenty of water. Water helps thin the mucus so your sinuses can drain more easily.  Use a humidifier.  Inhale steam 3 to 4 times a day (for example, sit in the bathroom with the shower running).  Apply a warm, moist washcloth to your face 3 to 4 times a day, or as directed by your health care provider.  Use saline nasal sprays to help moisten and clean  your sinuses.  Take medicines only as directed by your health care provider.  If you were prescribed either an antibiotic or antifungal medicine, finish it all even if you start to feel better. SEEK IMMEDIATE MEDICAL CARE IF:  You have increasing pain or severe headaches.  You have nausea, vomiting, or drowsiness.  You  have swelling around your face.  You have vision problems.  You have a stiff neck.  You have difficulty breathing. MAKE SURE YOU:   Understand these instructions.  Will watch your condition.  Will get help right away if you are not doing well or get worse. Document Released: 02/24/2005 Document Revised: 07/11/2013 Document Reviewed: 03/11/2011 [facility] Patient Information 2015 [place], [place]. This information is not intended to replace advice given to you by your health care provider. Make sure you discuss any questions you have with your health care provider.

## 2014-06-16 ENCOUNTER — Other Ambulatory Visit (HOSPITAL_COMMUNITY): Payer: Self-pay | Admitting: Obstetrics and Gynecology

## 2014-06-16 ENCOUNTER — Ambulatory Visit: Payer: BC Managed Care – PPO | Admitting: Obstetrics and Gynecology

## 2014-06-16 DIAGNOSIS — R112 Nausea with vomiting, unspecified: Secondary | ICD-10-CM

## 2014-06-20 ENCOUNTER — Ambulatory Visit (HOSPITAL_COMMUNITY): Payer: BC Managed Care – PPO

## 2014-06-26 ENCOUNTER — Ambulatory Visit (HOSPITAL_COMMUNITY)
Admission: RE | Admit: 2014-06-26 | Discharge: 2014-06-26 | Disposition: A | Discharge status: Home / Self Care | Payer: BLUE CROSS/BLUE SHIELD | Source: Ambulatory Visit | Attending: Obstetrics and Gynecology | Admitting: Obstetrics and Gynecology

## 2014-06-26 ENCOUNTER — Other Ambulatory Visit (HOSPITAL_COMMUNITY): Payer: Self-pay | Admitting: Obstetrics and Gynecology

## 2014-06-26 DIAGNOSIS — R110 Nausea: Secondary | ICD-10-CM | POA: Diagnosis not present

## 2014-06-26 DIAGNOSIS — K8020 Calculus of gallbladder without cholecystitis without obstruction: Secondary | ICD-10-CM | POA: Insufficient documentation

## 2014-06-26 DIAGNOSIS — R112 Nausea with vomiting, unspecified: Secondary | ICD-10-CM

## 2014-06-26 DIAGNOSIS — R1011 Right upper quadrant pain: Secondary | ICD-10-CM | POA: Insufficient documentation

## 2014-09-20 ENCOUNTER — Other Ambulatory Visit: Payer: Self-pay | Admitting: Obstetrics and Gynecology

## 2014-09-20 ENCOUNTER — Other Ambulatory Visit (HOSPITAL_COMMUNITY): Payer: Self-pay | Admitting: Obstetrics and Gynecology

## 2014-09-20 DIAGNOSIS — O26843 Uterine size-date discrepancy, third trimester: Secondary | ICD-10-CM

## 2014-09-20 DIAGNOSIS — Z3A35 35 weeks gestation of pregnancy: Secondary | ICD-10-CM

## 2014-09-20 DIAGNOSIS — Z3689 Encounter for other specified antenatal screening: Secondary | ICD-10-CM

## 2014-09-20 LAB — US OB FOLLOW UP

## 2014-09-20 LAB — OB RESULTS CONSOLE GBS: STREP GROUP B AG: NEGATIVE

## 2014-09-21 ENCOUNTER — Ambulatory Visit (HOSPITAL_COMMUNITY)
Admission: RE | Admit: 2014-09-21 | Discharge: 2014-09-21 | Disposition: A | Discharge status: Home / Self Care | Payer: BLUE CROSS/BLUE SHIELD | Source: Ambulatory Visit | Attending: Internal Medicine | Admitting: Internal Medicine

## 2014-09-21 ENCOUNTER — Other Ambulatory Visit (HOSPITAL_COMMUNITY): Payer: Self-pay | Admitting: Obstetrics and Gynecology

## 2014-09-21 DIAGNOSIS — Z36 Encounter for antenatal screening of mother: Secondary | ICD-10-CM | POA: Diagnosis not present

## 2014-09-21 DIAGNOSIS — Z3689 Encounter for other specified antenatal screening: Secondary | ICD-10-CM

## 2014-09-21 DIAGNOSIS — Z3A35 35 weeks gestation of pregnancy: Secondary | ICD-10-CM | POA: Diagnosis not present

## 2014-09-21 DIAGNOSIS — O26843 Uterine size-date discrepancy, third trimester: Secondary | ICD-10-CM

## 2014-09-21 DIAGNOSIS — O09513 Supervision of elderly primigravida, third trimester: Secondary | ICD-10-CM

## 2014-09-21 DIAGNOSIS — O26849 Uterine size-date discrepancy, unspecified trimester: Secondary | ICD-10-CM | POA: Insufficient documentation

## 2014-09-21 DIAGNOSIS — O09519 Supervision of elderly primigravida, unspecified trimester: Secondary | ICD-10-CM | POA: Insufficient documentation

## 2014-09-25 ENCOUNTER — Encounter (HOSPITAL_COMMUNITY): Payer: Self-pay | Admitting: Obstetrics and Gynecology

## 2014-09-25 ENCOUNTER — Other Ambulatory Visit (HOSPITAL_COMMUNITY): Payer: Self-pay | Admitting: Obstetrics and Gynecology

## 2014-09-26 ENCOUNTER — Ambulatory Visit (INDEPENDENT_AMBULATORY_CARE_PROVIDER_SITE_OTHER): Payer: Self-pay | Admitting: Pediatrics

## 2014-09-26 DIAGNOSIS — Z7681 Expectant parent(s) prebirth pediatrician visit: Secondary | ICD-10-CM

## 2014-09-26 NOTE — Progress Notes (Signed)
Prenatal counseling for impending newborn done-- Z76.81  

## 2014-10-03 ENCOUNTER — Inpatient Hospital Stay (HOSPITAL_COMMUNITY)
Admission: AD | Admit: 2014-10-03 | Discharge: 2014-10-06 | DRG: 774 | Disposition: A | Discharge status: Home / Self Care | Payer: BLUE CROSS/BLUE SHIELD | Source: Ambulatory Visit | Attending: Obstetrics and Gynecology | Admitting: Obstetrics and Gynecology

## 2014-10-03 ENCOUNTER — Inpatient Hospital Stay (HOSPITAL_COMMUNITY): Payer: BLUE CROSS/BLUE SHIELD

## 2014-10-03 ENCOUNTER — Encounter (HOSPITAL_COMMUNITY): Payer: Self-pay | Admitting: *Deleted

## 2014-10-03 DIAGNOSIS — O368132 Decreased fetal movements, third trimester, fetus 2: Secondary | ICD-10-CM

## 2014-10-03 DIAGNOSIS — Z3A37 37 weeks gestation of pregnancy: Secondary | ICD-10-CM | POA: Diagnosis present

## 2014-10-03 DIAGNOSIS — O99214 Obesity complicating childbirth: Secondary | ICD-10-CM | POA: Diagnosis present

## 2014-10-03 DIAGNOSIS — K219 Gastro-esophageal reflux disease without esophagitis: Secondary | ICD-10-CM | POA: Diagnosis present

## 2014-10-03 DIAGNOSIS — O09513 Supervision of elderly primigravida, third trimester: Secondary | ICD-10-CM | POA: Diagnosis not present

## 2014-10-03 DIAGNOSIS — O9962 Diseases of the digestive system complicating childbirth: Secondary | ICD-10-CM | POA: Diagnosis present

## 2014-10-03 DIAGNOSIS — Z87891 Personal history of nicotine dependence: Secondary | ICD-10-CM

## 2014-10-03 DIAGNOSIS — G932 Benign intracranial hypertension: Secondary | ICD-10-CM | POA: Diagnosis present

## 2014-10-03 DIAGNOSIS — K8020 Calculus of gallbladder without cholecystitis without obstruction: Secondary | ICD-10-CM | POA: Diagnosis present

## 2014-10-03 DIAGNOSIS — O1092 Unspecified pre-existing hypertension complicating childbirth: Secondary | ICD-10-CM | POA: Diagnosis present

## 2014-10-03 DIAGNOSIS — Z6841 Body Mass Index (BMI) 40.0 and over, adult: Secondary | ICD-10-CM | POA: Diagnosis not present

## 2014-10-03 DIAGNOSIS — O365930 Maternal care for other known or suspected poor fetal growth, third trimester, not applicable or unspecified: Principal | ICD-10-CM | POA: Diagnosis present

## 2014-10-03 DIAGNOSIS — O368190 Decreased fetal movements, unspecified trimester, not applicable or unspecified: Secondary | ICD-10-CM | POA: Insufficient documentation

## 2014-10-03 DIAGNOSIS — O283 Abnormal ultrasonic finding on antenatal screening of mother: Secondary | ICD-10-CM | POA: Insufficient documentation

## 2014-10-03 LAB — TYPE AND SCREEN
ABO/RH(D): O POS
ANTIBODY SCREEN: NEGATIVE

## 2014-10-03 LAB — CBC
HCT: 34.4 % — ABNORMAL LOW (ref 36.0–46.0)
Hemoglobin: 11.5 g/dL — ABNORMAL LOW (ref 12.0–15.0)
MCH: 28.3 pg (ref 26.0–34.0)
MCHC: 33.4 g/dL (ref 30.0–36.0)
MCV: 84.7 fL (ref 78.0–100.0)
Platelets: 162 10*3/uL (ref 150–400)
RBC: 4.06 MIL/uL (ref 3.87–5.11)
RDW: 14.3 % (ref 11.5–15.5)
WBC: 6.7 10*3/uL (ref 4.0–10.5)

## 2014-10-03 LAB — ABO/RH: ABO/RH(D): O POS

## 2014-10-03 MED ORDER — OXYTOCIN 40 UNITS IN LACTATED RINGERS INFUSION - SIMPLE MED: 62.5000 mL/h | INTRAVENOUS | Status: DC

## 2014-10-03 MED ORDER — OXYTOCIN BOLUS FROM INFUSION: 500.0000 mL | INTRAVENOUS | Status: DC

## 2014-10-03 MED ORDER — ZOLPIDEM TARTRATE 5 MG PO TABS
5.0000 mg | ORAL_TABLET | Freq: Every evening | ORAL | Status: DC | PRN
  Administered 2014-10-03: 5 mg via ORAL
  Filled 2014-10-03: qty 1

## 2014-10-03 MED ORDER — ACETAMINOPHEN 325 MG PO TABS
650.0000 mg | ORAL_TABLET | ORAL | Status: DC | PRN
  Administered 2014-10-04: 650 mg via ORAL
  Filled 2014-10-03: qty 2

## 2014-10-03 MED ORDER — TERBUTALINE SULFATE 1 MG/ML IJ SOLN: 0.2500 mg | Freq: Once | INTRAMUSCULAR | Status: AC | PRN

## 2014-10-03 MED ORDER — MISOPROSTOL 25 MCG QUARTER TABLET
25.0000 ug | ORAL_TABLET | ORAL | Status: DC | PRN
  Administered 2014-10-03 – 2014-10-04 (×2): 25 ug via VAGINAL
  Filled 2014-10-03: qty 0.25
  Filled 2014-10-03: qty 1
  Filled 2014-10-03: qty 0.25

## 2014-10-03 MED ORDER — ONDANSETRON HCL 4 MG/2ML IJ SOLN: 4.0000 mg | Freq: Four times a day (QID) | INTRAMUSCULAR | Status: DC | PRN

## 2014-10-03 MED ORDER — LACTATED RINGERS IV SOLN
INTRAVENOUS | Status: DC
  Administered 2014-10-03 (×2): via INTRAVENOUS

## 2014-10-03 MED ORDER — LACTATED RINGERS IV SOLN
500.0000 mL | INTRAVENOUS | Status: DC | PRN
  Administered 2014-10-04: 500 mL via INTRAVENOUS

## 2014-10-03 MED ORDER — OXYCODONE-ACETAMINOPHEN 5-325 MG PO TABS: 1.0000 | ORAL_TABLET | ORAL | Status: DC | PRN

## 2014-10-03 MED ORDER — OXYCODONE-ACETAMINOPHEN 5-325 MG PO TABS: 2.0000 | ORAL_TABLET | ORAL | Status: DC | PRN

## 2014-10-03 MED ORDER — PANTOPRAZOLE SODIUM 20 MG PO TBEC
20.0000 mg | DELAYED_RELEASE_TABLET | Freq: Two times a day (BID) | ORAL | Status: DC
  Administered 2014-10-03 – 2014-10-04 (×2): 20 mg via ORAL
  Filled 2014-10-03 (×4): qty 1

## 2014-10-03 MED ORDER — LIDOCAINE HCL (PF) 1 % IJ SOLN
30.0000 mL | INTRAMUSCULAR | Status: DC | PRN
  Filled 2014-10-03: qty 30

## 2014-10-03 MED ORDER — CITRIC ACID-SODIUM CITRATE 334-500 MG/5ML PO SOLN
30.0000 mL | ORAL | Status: DC | PRN
  Administered 2014-10-03 – 2014-10-04 (×2): 30 mL via ORAL
  Filled 2014-10-03 (×2): qty 15

## 2014-10-03 MED ORDER — CALCIUM CARBONATE ANTACID 500 MG PO CHEW
1.0000 | CHEWABLE_TABLET | Freq: Once | ORAL | Status: AC
  Administered 2014-10-03: 200 mg via ORAL
  Filled 2014-10-03: qty 1

## 2014-10-03 MED ORDER — FLEET ENEMA 7-19 GM/118ML RE ENEM: 1.0000 | ENEMA | RECTAL | Status: DC | PRN

## 2014-10-03 NOTE — Progress Notes (Signed)
FHTs 120s, GSTV, NST R- cat 1 tracing

## 2014-10-03 NOTE — H&P (Signed)
39 y.o. [redacted]w[redacted]d  [name] comes in today for antenatal testing for lagging growth and small AC and had a BPP 2/8 today.  Otherwise has good fetal movement and no bleeding.  NST in MAU is reactive but spoke with MFM on phone who agree to proceed with delivery.  Past Medical History  Diagnosis Date  . Trichomonas   . Chicken pox   . Abnormal Pap smear 1998    no treatment required  . Morbid obesity with body mass index of 60.0-69.9 in adult   . Bronchitis     hx of  . Multiple allergies   . Headache(784.0)     migraines  . GERD (gastroesophageal reflux disease)     mild  . Arthritis     knees  . Orbital pseudotumor   . Family history of anesthesia complication     N/V, extreme chills    Past Surgical History  Procedure Laterality Date  . Spinal tap    . Laparoscopic gastric sleeve resection N/A 08/31/2012    Procedure: LAPAROSCOPIC GASTRIC SLEEVE RESECTION;  Surgeon: Lodema Pilot, DO;  Location: [facility];  Service: General;  Laterality: N/A;  Laparoscopic Sleeve Gastrectomy with EGD    OB History  Gravida Para Term Preterm AB SAB TAB Ectopic Multiple Living     # Outcome Date GA Lbr Len/2nd Weight Sex Delivery Anes PTL Lv  1 Current               History   Social History  . Marital Status: Married    Spouse Name: N/A  . Number of Children: 0  . Years of Education: 16   Occupational History  . teacher [employer]   Social History Main Topics  . Smoking status: Former Smoker -- 0.50 packs/day for 10 years    Types: Cigarettes    Quit date: 03/10/2010  . Smokeless tobacco: Never Used  . Alcohol Use: No  . Drug Use: No  . Sexual Activity:    Partners: Male    Birth Control/ Protection: None   Other Topics Concern  . Not on file   Social History Narrative   Tree extract; Fruit & vegetable daily; and Peanuts    Prenatal Transfer Tool  Maternal Diabetes: No Genetic Screening: Normal- amnio 71 XX Maternal Ultrasounds/Referrals:  Abnormal:  Findings:   Other: small for gestational age; pt has gastroparesis after gastric sleeve surgery and was losing weight in the last trimester.  Her weight and nutrition have been stable over the last 3 weeks on Reglan.   Fetal Ultrasounds or other Referrals:  Referred to Materal Fetal Medicine  Maternal Substance Abuse:  No Significant Maternal Medications:  None Significant Maternal Lab Results: None  Other PNC: uncomplicated.  Pt has uncomplicated pseudotumor cerebri which has not caused any sx. Pt has gallstones but no obstruction.  Pt has had normal A1C and TSH in this pregnancy.    Filed Vitals:   10/03/14 1445  BP: 121/67  Pulse: 85  Temp: 97.9 F (36.6 C)  Resp: 18     Lungs/Cor:  NAD Abdomen:  soft, gravid Ex:  no cords, erythema SVE:  1/30/-2, VTX FHTs:  130s, good STV, NST R Toco:  q occ   A/P   Term pregnancy, SGA- today 19%ile but BPP 2/10.  D/w MFM on phone- agree we should induce.    GBS neg.  Katherine Baldwin A

## 2014-10-03 NOTE — MAU Provider Note (Signed)
Chief Complaint: No chief complaint on file.   First Provider Initiated Contact with Patient 10/03/14 1512     SUBJECTIVE HPI: Katherine Baldwin is a 39 y.o. G1P0 at [redacted]w[redacted]d reported decreased fetal movement at her routine prenatal visit today. Office BPP result was 2/8. She states she felt good movement at 10 AM today and has felt 2 movements since being monitored and MAU. VE today: Cx 1 cm. Reports Deberah Pelton but no painful contractions. Denies leakage of fluid or vaginal bleeding.  Prenatal course significant for AMA; fetal growth lag with most recent scan 09/21/2014 at 24th percentile with AC at 20th percentile, AFI 14; bariatric surgery 2014   Past Medical History  Diagnosis Date  . Trichomonas   . Chicken pox   . Abnormal Pap smear 1998    no treatment required  . Morbid obesity with body mass index of 60.0-69.9 in adult   . Bronchitis     hx of  . Multiple allergies   . Headache(784.0)     migraines  . GERD (gastroesophageal reflux disease)     mild  . Arthritis     knees  . Orbital pseudotumor   . Family history of anesthesia complication     N/V, extreme chills   OB History  Gravida Para Term Preterm AB SAB TAB Ectopic Multiple Living  1 0 0 0 0 0 0 0 0 0     # Outcome Date GA Lbr Len/2nd Weight Sex Delivery Anes PTL Lv  1 Current              Past Surgical History  Procedure Laterality Date  . Spinal tap    . Laparoscopic gastric sleeve resection N/A 08/31/2012    Procedure: LAPAROSCOPIC GASTRIC SLEEVE RESECTION;  Surgeon: Lodema Pilot, DO;  Location: [facility];  Service: General;  Laterality: N/A;  Laparoscopic Sleeve Gastrectomy with EGD   History   Social History  . Marital Status: Married    Spouse Name: N/A  . Number of Children: 0  . Years of Education: 16   Occupational History  . teacher [employer]   Social History Main Topics  . Smoking status: Former Smoker -- 0.50 packs/day for 10 years    Types: Cigarettes    Quit date:  03/10/2010  . Smokeless tobacco: Never Used  . Alcohol Use: No  . Drug Use: No  . Sexual Activity:    Partners: Male    Birth Control/ Protection: None   Other Topics Concern  . Not on file   Social History Narrative   No current facility-administered medications on file prior to encounter.   Current Outpatient Prescriptions on File Prior to Encounter  Medication Sig Dispense Refill  . acetaminophen (TYLENOL) 500 MG tablet Take 500 mg by mouth every 6 (six) hours as needed.    Marland Kitchen amoxicillin (AMOXIL) 500 MG capsule Take 2 capsules (1,000 mg total) by mouth 2 (two) times daily. 40 capsule 0  . calcium carbonate (TUMS - DOSED IN MG ELEMENTAL CALCIUM) 500 MG chewable tablet Chew 1-2 tablets by mouth as needed for indigestion or heartburn.    . Cyanocobalamin (VITAMIN B 12 PO) Take 1 tablet by mouth daily.     . diphenhydrAMINE (BENADRYL) 25 MG tablet Take 25 mg by mouth every 6 (six) hours as needed.    . doxylamine, Sleep, (UNISOM) 25 MG tablet Take 25 mg by mouth at bedtime as needed.    Marland Kitchen esomeprazole (NEXIUM) 20 MG capsule Take 20  mg by mouth daily at 12 noon.    Marland Kitchen FIBER SELECT GUMMIES PO Take 2 capsules by mouth daily.    Marland Kitchen FOLIC ACID PO Take 1,000 mg by mouth daily.    . Prenatal Vit-Fe Fumarate-FA (PRENATAL MULTIVITAMIN) TABS tablet Take 1 tablet by mouth daily at 12 noon. With dha and omega 3     Allergies  Allergen Reactions  . Tree Extract Itching  . Fruit & Vegetable Daily [Nutritional Supplements]     Fruits and uncooked vegetables-itchy mouth and throat. Patient reports this is transient and she never knows which foods will have a rxn  . Peanuts [Peanut Oil] Itching    Itching in mouth    ROS  OBJECTIVE Blood pressure 121/67, pulse 85, temperature 97.9 F (36.6 C), temperature source Oral, resp. rate 18, last menstrual period 01/12/2014. GENERAL: Well-developed, well-nourished female in no acute distress.  HEART: normal rate RESP: normal effort GI: Abdomen soft,  non-tender. Positive bowel sounds 4. MS: Nontender, no edema NEURO: Alert and oriented  LAB RESULTS No results found for this or any previous visit (from the past 24 hour(s)).  IMAGING US Ob Detail + 14 Wk  09/21/2014   OBSTETRICAL ULTRASOUND: This exam was performed within a [facility]. The OB US report was generated in the AS system, and faxed to the ordering physician.   This report is available in the [facility] [employer]. See the AS Obstetric US report via the Image Link.  US Fetal Bpp W/o Non Stress  09/21/2014   OBSTETRICAL ULTRASOUND: This exam was performed within a  Ultrasound Department. The OB US report was generated in the AS system, and faxed to the ordering physician.   This report is available in the [facility] [employer]. See the AS Obstetric US report via the Image Link.      ASSESSMENT 39 yo G1 at [redacted]w[redacted]d Decreased fetal movement with abnormal office BPP  PLAN C/W Dr. Henderson Cloud admit    Danae Orleans, CNM 10/03/2014  3:32 PM

## 2014-10-03 NOTE — MAU Note (Signed)
Sent from ob office with a BPP in office of 2of8 for monitoring and observation.  Braxton hicks contractions periodically, irregular  Denies red vaginal bleeding.  Positive fetal movement this morning around 10am last  Denies SROM/LOF GBS negative per patient

## 2014-10-03 NOTE — Progress Notes (Signed)
Pt was sent to [place] before I could cancel order.  Limted US shows vtx, normal fluid and gallstones or calcifications seen in fetus.  Uncertain what clinically that means- will alert peds to this pp.

## 2014-10-04 ENCOUNTER — Inpatient Hospital Stay (HOSPITAL_COMMUNITY): Payer: BLUE CROSS/BLUE SHIELD | Admitting: Anesthesiology

## 2014-10-04 ENCOUNTER — Encounter (HOSPITAL_COMMUNITY): Payer: Self-pay | Admitting: *Deleted

## 2014-10-04 LAB — RPR: RPR: NONREACTIVE

## 2014-10-04 MED ORDER — ONDANSETRON HCL 4 MG PO TABS: 4.0000 mg | ORAL_TABLET | ORAL | Status: DC | PRN

## 2014-10-04 MED ORDER — EPHEDRINE 5 MG/ML INJ
10.0000 mg | INTRAVENOUS | Status: DC | PRN
  Filled 2014-10-04: qty 2

## 2014-10-04 MED ORDER — LANOLIN HYDROUS EX OINT: TOPICAL_OINTMENT | CUTANEOUS | Status: DC | PRN

## 2014-10-04 MED ORDER — WITCH HAZEL-GLYCERIN EX PADS: 1.0000 "application " | MEDICATED_PAD | CUTANEOUS | Status: DC | PRN

## 2014-10-04 MED ORDER — OXYCODONE-ACETAMINOPHEN 5-325 MG PO TABS: 2.0000 | ORAL_TABLET | ORAL | Status: DC | PRN

## 2014-10-04 MED ORDER — FENTANYL 2.5 MCG/ML BUPIVACAINE 1/10 % EPIDURAL INFUSION (WH - ANES)
14.0000 mL/h | INTRAMUSCULAR | Status: DC | PRN
  Administered 2014-10-04: 14 mL/h via EPIDURAL
  Filled 2014-10-04: qty 125

## 2014-10-04 MED ORDER — PRENATAL MULTIVITAMIN CH
1.0000 | ORAL_TABLET | Freq: Every day | ORAL | Status: DC
  Administered 2014-10-05 – 2014-10-06 (×2): 1 via ORAL
  Filled 2014-10-04 (×2): qty 1

## 2014-10-04 MED ORDER — IBUPROFEN 600 MG PO TABS
600.0000 mg | ORAL_TABLET | Freq: Four times a day (QID) | ORAL | Status: DC
  Administered 2014-10-04 – 2014-10-06 (×8): 600 mg via ORAL
  Filled 2014-10-04 (×8): qty 1

## 2014-10-04 MED ORDER — TERBUTALINE SULFATE 1 MG/ML IJ SOLN
0.2500 mg | Freq: Once | INTRAMUSCULAR | Status: DC | PRN
  Filled 2014-10-04: qty 1

## 2014-10-04 MED ORDER — SENNOSIDES-DOCUSATE SODIUM 8.6-50 MG PO TABS
2.0000 | ORAL_TABLET | ORAL | Status: DC
  Administered 2014-10-04 – 2014-10-06 (×2): 2 via ORAL
  Filled 2014-10-04 (×2): qty 2

## 2014-10-04 MED ORDER — LIDOCAINE HCL (PF) 1 % IJ SOLN
INTRAMUSCULAR | Status: DC | PRN
  Administered 2014-10-04: 6 mL
  Administered 2014-10-04: 4 mL

## 2014-10-04 MED ORDER — DIBUCAINE 1 % RE OINT: 1.0000 "application " | TOPICAL_OINTMENT | RECTAL | Status: DC | PRN

## 2014-10-04 MED ORDER — ONDANSETRON HCL 4 MG/2ML IJ SOLN: 4.0000 mg | INTRAMUSCULAR | Status: DC | PRN

## 2014-10-04 MED ORDER — PHENYLEPHRINE 40 MCG/ML (10ML) SYRINGE FOR IV PUSH (FOR BLOOD PRESSURE SUPPORT)
80.0000 ug | PREFILLED_SYRINGE | INTRAVENOUS | Status: DC | PRN
  Filled 2014-10-04: qty 20
  Filled 2014-10-04: qty 2

## 2014-10-04 MED ORDER — BENZOCAINE-MENTHOL 20-0.5 % EX AERO
1.0000 "application " | INHALATION_SPRAY | CUTANEOUS | Status: DC | PRN
  Administered 2014-10-05 – 2014-10-06 (×2): 1 via TOPICAL
  Filled 2014-10-04 (×2): qty 56

## 2014-10-04 MED ORDER — SIMETHICONE 80 MG PO CHEW
80.0000 mg | CHEWABLE_TABLET | ORAL | Status: DC | PRN
Start: 2014-10-04 — End: 2014-10-06

## 2014-10-04 MED ORDER — OXYTOCIN 40 UNITS IN LACTATED RINGERS INFUSION - SIMPLE MED
1.0000 m[IU]/min | INTRAVENOUS | Status: DC
  Administered 2014-10-04: 2 m[IU]/min via INTRAVENOUS
  Filled 2014-10-04: qty 1000

## 2014-10-04 MED ORDER — OXYCODONE-ACETAMINOPHEN 5-325 MG PO TABS
1.0000 | ORAL_TABLET | ORAL | Status: DC | PRN
  Administered 2014-10-04: 1 via ORAL
  Filled 2014-10-04: qty 1

## 2014-10-04 MED ORDER — FENTANYL 2.5 MCG/ML BUPIVACAINE 1/10 % EPIDURAL INFUSION (WH - ANES): 14.0000 mL/h | INTRAMUSCULAR | Status: DC | PRN

## 2014-10-04 MED ORDER — TETANUS-DIPHTH-ACELL PERTUSSIS 5-2.5-18.5 LF-MCG/0.5 IM SUSP: 0.5000 mL | Freq: Once | INTRAMUSCULAR | Status: DC

## 2014-10-04 MED ORDER — ACETAMINOPHEN 325 MG PO TABS: 650.0000 mg | ORAL_TABLET | ORAL | Status: DC | PRN

## 2014-10-04 MED ORDER — DIPHENHYDRAMINE HCL 25 MG PO CAPS: 25.0000 mg | ORAL_CAPSULE | Freq: Four times a day (QID) | ORAL | Status: DC | PRN

## 2014-10-04 MED ORDER — DIPHENHYDRAMINE HCL 50 MG/ML IJ SOLN: 12.5000 mg | INTRAMUSCULAR | Status: DC | PRN

## 2014-10-04 NOTE — Progress Notes (Signed)
FHTs 120s, gSTV, NST R Toco occ  SVE 1-2/70/-2, AROM clear.  Start pitocin.

## 2014-10-04 NOTE — Progress Notes (Signed)
Pt instructed in perineal care and cleaning after voiding. Pt states she had not been shown how to use wawter bottle. Pt instructed in use

## 2014-10-04 NOTE — Progress Notes (Signed)
Pt hit call light, RN to room, pt states "my baby", pulled back covers, baby was delivered to knees, called out for DR clark and back RN's. Baby delivered and to chest for skin to skin.

## 2014-10-04 NOTE — Anesthesia Postprocedure Evaluation (Signed)
  Anesthesia Post-op Note  Patient: Katherine Baldwin  Procedure(s) Performed: * No procedures listed *  Patient Location: Mother/Baby  Anesthesia Type:Epidural  Level of Consciousness: awake  Airway and Oxygen Therapy: Patient Spontanous Breathing  Post-op Pain: mild  Post-op Assessment: Patient's Cardiovascular Status Stable and Respiratory Function Stable              Post-op Vital Signs: stable  Last Vitals:  Filed Vitals:   10/04/14 1147  BP: 104/57  Pulse: 68  Temp:   Resp: 18    Complications: No apparent anesthesia complications

## 2014-10-04 NOTE — Anesthesia Preprocedure Evaluation (Addendum)
Anesthesia Evaluation  Patient identified by MRN, date of birth, ID band Patient awake    Reviewed: Allergy & Precautions, H&P , Patient's Chart, lab work & pertinent test results  Airway Mallampati: II  TM Distance: >3 FB Neck ROM: full    Dental  (+) Teeth Intact   Pulmonary former smoker,  breath sounds clear to auscultation        Cardiovascular Rhythm:regular Rate:Normal     Neuro/Psych    GI/Hepatic GERD-  ,  Endo/Other  Morbid obesity  Renal/GU      Musculoskeletal   Abdominal   Peds  Hematology   Anesthesia Other Findings       Reproductive/Obstetrics (+) Pregnancy                            Anesthesia Physical Anesthesia Plan  ASA: III  Anesthesia Plan: Epidural   Post-op Pain Management:    Induction:   Airway Management Planned:   Additional Equipment:   Intra-op Plan:   Post-operative Plan:   Informed Consent: I have reviewed the patients History and Physical, chart, labs and discussed the procedure including the risks, benefits and alternatives for the proposed anesthesia with the patient or authorized representative who has indicated his/her understanding and acceptance.   Dental Advisory Given  Plan Discussed with:   Anesthesia Plan Comments: (Labs checked- platelets confirmed with RN in room. Fetal heart tracing, per RN, reported to be stable enough for sitting procedure. Discussed epidural, and patient consents to the procedure:  included risk of possible headache,backache, failed block, allergic reaction, and nerve injury. This patient was asked if she had any questions or concerns before the procedure started.)        Anesthesia Quick Evaluation

## 2014-10-04 NOTE — Anesthesia Procedure Notes (Signed)

## 2014-10-05 LAB — CBC
HCT: 30.8 % — ABNORMAL LOW (ref 36.0–46.0)
Hemoglobin: 10 g/dL — ABNORMAL LOW (ref 12.0–15.0)
MCH: 27.9 pg (ref 26.0–34.0)
MCHC: 32.5 g/dL (ref 30.0–36.0)
MCV: 85.8 fL (ref 78.0–100.0)
PLATELETS: 127 10*3/uL — AB (ref 150–400)
RBC: 3.59 MIL/uL — ABNORMAL LOW (ref 3.87–5.11)
RDW: 14.4 % (ref 11.5–15.5)
WBC: 7.9 10*3/uL (ref 4.0–10.5)

## 2014-10-05 NOTE — Lactation Note (Signed)
This note was copied from the chart of Katherine Alleigh Mollica. Lactation Consultation Note New mom having difficulty baby latching. Mom has large pendulum breast w/flat nipples at the bottom of breast. Pitting edema to breast and areolas. Reverse pressure to areola to assist nipples in everting more. Very short  Shaft on nipples and when compressed flatten. Mom has lots of colostrum. She demonstrated hand expression and collected 2ml colostrum. Fitted w/#20NS, inserted 2ml colostrum. Baby latched great, gulping at the breast. Mom taught how to apply & clean nipple shield. Mom will need assistance from FOB d/t large breast. Encouraged mom to elevate breast w/cloth. BF in football position. Gave mom shells to wear between BF in bra, encouraged to wear supportive bra to help w/edema.  Mom encouraged to feed baby 8-12 times/24 hours and with feeding cues. Mom encouraged to waken baby for feeds. Mom encouraged to do skin-to-skin. Referred to Baby and Me Book in Breastfeeding section Pg. 22-23 for position options and Proper latch demonstration. Encouraged comfort during BF so colostrum flows better and mom will enjoy the feeding longer. Taking deep breaths and breast massage during BF. Educated about newborn behavior, I&O, cluster feeding, supply and demand.  [facility]/LC brochure given w/resources, support groups and LC services. Patient Name: Katherine Baldwin ZOXWR'U Date: 10/05/2014 Reason for consult: Initial assessment   Maternal Data Has patient been taught Hand Expression?: Yes Does the patient have breastfeeding experience prior to this delivery?: No  Feeding Feeding Type: Breast Milk Length of feed: 10 min  LATCH Score/Interventions Latch: Grasps breast easily, tongue down, lips flanged, rhythmical sucking. Intervention(s): Skin to skin;Teach feeding cues;Waking techniques  Audible Swallowing: Spontaneous and intermittent Intervention(s): Skin to skin;Hand expression  Type of Nipple:  Flat Intervention(s): Shells;Reverse pressure;Hand pump  Comfort (Breast/Nipple): Soft / non-tender     Hold (Positioning): Assistance needed to correctly position infant at breast and maintain latch. Intervention(s): Skin to skin;Position options;Support Pillows;Breastfeeding basics reviewed  LATCH Score: 8  Lactation Tools Discussed/Used Tools: Shells;Nipple Dorris Carnes;Pump Nipple shield size: 20 Shell Type: Inverted Breast pump type: Manual Pump Review: Setup, frequency, and cleaning;Milk Storage Initiated by:: RN Date initiated:: 10/04/14   Consult Status Consult Status: Follow-up Date: 10/05/14 (in pm) Follow-up type: In-patient    Charyl Dancer 10/05/2014, 4:47 AM

## 2014-10-05 NOTE — Progress Notes (Signed)
Post Partum Day 1 Subjective: Pain controlled, no nausea or vomiting, lochia appropriate, ambulating and voiding without difficulty  Objective: Blood pressure 102/56, pulse 62, temperature 98 F (36.7 C), temperature source Oral, resp. rate 18, height  (1.626 m), weight 119.296 kg (263 lb), last menstrual period 01/12/2014, SpO2 100 %, unknown if currently breastfeeding.  Physical Exam:  General: alert, cooperative and appears stated age Lochia: appropriate Uterine Fundus: firm   Recent Labs  10/03/14 1750 10/05/14 0520  HGB 11.5* 10.0*  HCT 34.4* 30.8*    Assessment/Plan: Routine pp care   LOS: 2 days   Katherine Baldwin H. 10/05/2014, 9:26 AM

## 2014-10-05 NOTE — Lactation Note (Addendum)
This note was copied from the chart of Katherine Baldwin. Lactation Consultation Note  P1, Baby < 6 lbs. with +DAT and 3.7% weight loss at approx 16 hours.  [redacted]w[redacted]d.  Mother using nipple shield. Baby has not breastfeed in 4 hours. Mother states Peds MD told her to breastfeed on demand. Suggest to mother that if baby does not wake after 3 hours for feeding, place baby STS.  Undress baby to diaper and keep hat on. Small babies may need to wake to breastfeed because baby may not show feeding cues.  Mother hand expressed excellent flow of colostrum.   Attempted latching without nipple shield.   Applied #20NS and baby latched.  Sucks and swallows observed. After 20 min baby became sleepy.  Noted colostrum in NS at end of feeding. Set up DEBP and suggest mother post pump 4-5xday for 10-15 min. Reviewed cleaning and milk storage and reviewed how to use spoon, foley cup and finger syringe feed with pumped colostrum. Suggest she call if she needs further assistance.     Patient Name: Katherine Baldwin ZOXWR'U Date: 10/05/2014     Maternal Data    Feeding Feeding Type: Breast Fed Length of feed: 10 min  LATCH Score/Interventions                      Lactation Tools Discussed/Used     Consult Status      Hardie Pulley 10/05/2014, 11:29 AM

## 2014-10-06 MED ORDER — RANITIDINE HCL 150 MG PO TABS: 150.0000 mg | ORAL_TABLET | Freq: Two times a day (BID) | ORAL | Status: DC

## 2014-10-06 NOTE — Lactation Note (Signed)
This note was copied from the chart of [name]. Lactation Consultation Note  Discussed <6lb feeding behavior.  Mother states baby is now latching without nipple shield. Baby's recent feedings have only been 5-10 min then baby become sleepy. Discussed the importance of post pumping and giving baby pumped breastmilk. Left LC phone number and mother will call to view feeding.    Patient Name: [name] ZOXWR'U Date: 10/06/2014     Maternal Data    Feeding    LATCH Score/Interventions                      Lactation Tools Discussed/Used     Consult Status      [name] [facility] 10/06/2014, 10:48 AM

## 2014-10-06 NOTE — Lactation Note (Signed)
This note was copied from the chart of Katherine Deyonna Fitzsimmons. Lactation Consultation Note  Mother latched baby without NS.  Baby breastfed for 5 min and fell asleep. Suggest undressing baby for feedings and massaging breast during feeding to keep her active. Sucks and swallows observed and heard.  Observed feeding for 10 more min.  Praised mother for efforts. Reminded mother that if baby continues to fall asleep at the breast to return to using NS until feedings can be sustained for longer period and then the importance of supplementing with pumped breastmilk. Encouraged mother to post pump a few times a day and give back to baby.  Discussed transitioning to slow flow nipple if needed. Reviewed engorgement care and monitoring voids/stools.   Patient Name: Katherine Baldwin Date: 10/06/2014 Reason for consult: Follow-up assessment   Maternal Data    Feeding Feeding Type: Breast Fed Length of feed: 15 min  LATCH Score/Interventions Latch: Grasps breast easily, tongue down, lips flanged, rhythmical sucking. Intervention(s): Skin to skin  Audible Swallowing: Spontaneous and intermittent  Type of Nipple: Everted at rest and after stimulation  Comfort (Breast/Nipple): Soft / non-tender     Hold (Positioning): Assistance needed to correctly position infant at breast and maintain latch.  LATCH Score: 9  Lactation Tools Discussed/Used     Consult Status Consult Status: Complete    Hardie Pulley 10/06/2014, 12:33 PM

## 2014-10-06 NOTE — Discharge Summary (Signed)
Obstetric Discharge Summary Reason for Admission: onset of labor Prenatal Procedures: NST and ultrasound Intrapartum Procedures: spontaneous vaginal delivery Postpartum Procedures: none Complications-Operative and Postpartum: 1st degree perineal laceration HEMOGLOBIN  Date Value Ref Range Status  10/05/2014 10.0* 12.0 - 15.0 g/dL Final   HCT  Date Value Ref Range Status  10/05/2014 30.8* 36.0 - 46.0 % Final    Physical Exam:  General: alert, cooperative and appears stated age 16: appropriate Uterine Fundus: firm  Discharge Diagnoses: Term Pregnancy-delivered  Discharge Information: Date: 10/06/2014 Activity: pelvic rest Diet: routine Medications: Ibuprofen, Colace and Percocet Condition: improved Instructions: refer to practice specific booklet Discharge to: home Follow-up Information    Follow up with [facility] GEFFEL Chestine Spore, MD In 4 weeks.   Specialty:  Obstetrics   Why:  For a postpartum evaluation   Contact information:   [address] [place] [address] [phone]       Newborn Data: Live born female  Birth Weight: 5 lb 14.7 oz (2685 g) APGAR: 8, 9  Home with mother.  Zonnie Landen H. 10/06/2014, 12:38 AM

## 2014-10-06 NOTE — Progress Notes (Signed)
PPD#2 Pt c/o heartburn. IMP/ S/P SVD, ready for discharge. PLAN/ Will discharge.

## 2014-10-27 ENCOUNTER — Other Ambulatory Visit: Payer: Self-pay | Admitting: General Surgery

## 2015-01-08 ENCOUNTER — Encounter: Payer: Self-pay | Admitting: Pediatrics

## 2016-01-28 ENCOUNTER — Encounter (HOSPITAL_COMMUNITY): Payer: Self-pay

## 2016-02-08 ENCOUNTER — Other Ambulatory Visit: Payer: Self-pay | Admitting: Obstetrics and Gynecology

## 2016-02-08 DIAGNOSIS — Z1231 Encounter for screening mammogram for malignant neoplasm of breast: Secondary | ICD-10-CM

## 2016-03-18 ENCOUNTER — Ambulatory Visit
Admission: RE | Admit: 2016-03-18 | Discharge: 2016-03-18 | Disposition: A | Discharge status: Home / Self Care | Payer: BLUE CROSS/BLUE SHIELD | Source: Ambulatory Visit | Attending: Obstetrics and Gynecology | Admitting: Obstetrics and Gynecology

## 2016-03-18 DIAGNOSIS — Z1231 Encounter for screening mammogram for malignant neoplasm of breast: Secondary | ICD-10-CM | POA: Diagnosis not present

## 2016-04-04 DIAGNOSIS — H52223 Regular astigmatism, bilateral: Secondary | ICD-10-CM | POA: Diagnosis not present

## 2016-04-04 DIAGNOSIS — G932 Benign intracranial hypertension: Secondary | ICD-10-CM | POA: Diagnosis not present

## 2016-08-01 DIAGNOSIS — M170 Bilateral primary osteoarthritis of knee: Secondary | ICD-10-CM | POA: Diagnosis not present

## 2016-09-25 DIAGNOSIS — Z131 Encounter for screening for diabetes mellitus: Secondary | ICD-10-CM | POA: Diagnosis not present

## 2016-09-25 DIAGNOSIS — Z0000 Encounter for general adult medical examination without abnormal findings: Secondary | ICD-10-CM | POA: Diagnosis not present

## 2016-10-13 ENCOUNTER — Other Ambulatory Visit: Payer: Self-pay | Admitting: General Surgery

## 2016-10-13 DIAGNOSIS — K8010 Calculus of gallbladder with chronic cholecystitis without obstruction: Secondary | ICD-10-CM | POA: Diagnosis not present

## 2016-10-16 DIAGNOSIS — L218 Other seborrheic dermatitis: Secondary | ICD-10-CM | POA: Diagnosis not present

## 2016-11-14 NOTE — Pre-Procedure Instructions (Signed)
Huey RomansMarian M Brand  11/14/2016      [facility] [id number] [place]  - [address] [facility] [address] [place] [address] Phone: [phone] Fax: [phone]  [facility] [id number] [place] [address] [facility] [address] [place] [address] Phone: [phone] Fax: [phone]    Your procedure is scheduled on November 19, 2016.  [facility] at [facility] AM.  Call this number if you have problems the morning of surgery:  [phone]   Remember:  Do not eat food or drink liquids after midnight.  Take these medicines the morning of surgery with A SIP OF WATER cetirizine (zyrtec)-if needed, omeprazole (prilosec).  7 days prior to surgery STOP taking any Aspirin, Aleve, Naproxen, Ibuprofen, Motrin, Advil, Goody's, BC's, all herbal medications, fish oil, and all vitamins   Do not wear jewelry, make-up or nail polish.  Do not wear lotions, powders, or perfumes, or deoderant.  Do not shave 48 hours prior to surgery.  Men may shave face and neck.  Do not bring valuables to the hospital.  [facility] is not responsible for any belongings or valuables.  Contacts, dentures or bridgework may not be worn into surgery.  Leave your suitcase in the car.  After surgery it may be brought to your room.  For patients admitted to the hospital, discharge time will be determined by your treatment team.  Patients discharged the day of surgery will not be allowed to drive home.    Special instructions:   [place]- Preparing For Surgery  Before surgery, you can play an important role. Because skin is not sterile, your skin needs to be as free of germs as possible. You can reduce the number of germs on your skin by washing with CHG (chlorahexidine gluconate) Soap before surgery.  CHG is an antiseptic cleaner which kills germs and bonds with the skin to  continue killing germs even after washing.  Please do not use if you have an allergy to CHG or antibacterial soaps. If your skin becomes reddened/irritated stop using the CHG.  Do not shave (including legs and underarms) for at least 48 hours prior to first CHG shower. It is OK to shave your face.  Please follow these instructions carefully.   1. Shower the NIGHT BEFORE SURGERY and the MORNING OF SURGERY with CHG.   2. If you chose to wash your hair, wash your hair first as usual with your normal shampoo.  3. After you shampoo, rinse your hair and body thoroughly to remove the shampoo.  4. Use CHG as you would any other liquid soap. You can apply CHG directly to the skin and wash gently with a scrungie or a clean washcloth.   5. Apply the CHG Soap to your body ONLY FROM THE NECK DOWN.  Do not use on open wounds or open sores. Avoid contact with your eyes, ears, mouth and genitals (private parts). Wash genitals (private parts) with your normal soap.  6. Wash thoroughly, paying special attention to the area where your surgery will be performed.  7. Thoroughly rinse your body with warm water from the neck down.  8. DO NOT shower/wash with your normal soap after using and rinsing off the CHG Soap.  9. Pat yourself dry with a CLEAN TOWEL.   10. Wear CLEAN PAJAMAS   11. Place CLEAN  SHEETS on your bed the night of your first shower and DO NOT SLEEP WITH PETS.    Day of Surgery: Do not apply any deodorants/lotions. Please wear clean clothes to the hospital/surgery center.     Please read over the following fact sheets that you were given.

## 2016-11-17 ENCOUNTER — Encounter (HOSPITAL_COMMUNITY)
Admission: RE | Admit: 2016-11-17 | Discharge: 2016-11-17 | Disposition: A | Discharge status: Home / Self Care | Payer: BLUE CROSS/BLUE SHIELD | Source: Ambulatory Visit | Attending: General Surgery | Admitting: General Surgery

## 2016-11-17 ENCOUNTER — Encounter (HOSPITAL_COMMUNITY): Payer: Self-pay | Admitting: *Deleted

## 2016-11-17 DIAGNOSIS — Z87891 Personal history of nicotine dependence: Secondary | ICD-10-CM | POA: Diagnosis not present

## 2016-11-17 DIAGNOSIS — Z9884 Bariatric surgery status: Secondary | ICD-10-CM | POA: Diagnosis not present

## 2016-11-17 DIAGNOSIS — I2510 Atherosclerotic heart disease of native coronary artery without angina pectoris: Secondary | ICD-10-CM | POA: Diagnosis not present

## 2016-11-17 DIAGNOSIS — Z6841 Body Mass Index (BMI) 40.0 and over, adult: Secondary | ICD-10-CM | POA: Diagnosis not present

## 2016-11-17 DIAGNOSIS — K219 Gastro-esophageal reflux disease without esophagitis: Secondary | ICD-10-CM | POA: Diagnosis not present

## 2016-11-17 DIAGNOSIS — K8080 Other cholelithiasis without obstruction: Secondary | ICD-10-CM | POA: Diagnosis not present

## 2016-11-17 DIAGNOSIS — K8010 Calculus of gallbladder with chronic cholecystitis without obstruction: Secondary | ICD-10-CM | POA: Diagnosis not present

## 2016-11-17 DIAGNOSIS — I517 Cardiomegaly: Secondary | ICD-10-CM | POA: Diagnosis not present

## 2016-11-17 DIAGNOSIS — I700 Atherosclerosis of aorta: Secondary | ICD-10-CM | POA: Diagnosis not present

## 2016-11-17 HISTORY — DX: Personal history of other diseases of the digestive system: Z87.19

## 2016-11-17 LAB — CBC WITH DIFFERENTIAL/PLATELET
BASOS PCT: 0 %
Basophils Absolute: 0 10*3/uL (ref 0.0–0.1)
EOS ABS: 0.1 10*3/uL (ref 0.0–0.7)
EOS PCT: 1 %
HCT: 36 % (ref 36.0–46.0)
Hemoglobin: 11.3 g/dL — ABNORMAL LOW (ref 12.0–15.0)
LYMPHS ABS: 3 10*3/uL (ref 0.7–4.0)
Lymphocytes Relative: 35 %
MCH: 25.9 pg — AB (ref 26.0–34.0)
MCHC: 31.4 g/dL (ref 30.0–36.0)
MCV: 82.4 fL (ref 78.0–100.0)
MONO ABS: 0.5 10*3/uL (ref 0.1–1.0)
MONOS PCT: 6 %
NEUTROS PCT: 58 %
Neutro Abs: 5 10*3/uL (ref 1.7–7.7)
Platelets: 302 10*3/uL (ref 150–400)
RBC: 4.37 MIL/uL (ref 3.87–5.11)
RDW: 14.3 % (ref 11.5–15.5)
WBC: 8.7 10*3/uL (ref 4.0–10.5)

## 2016-11-17 LAB — COMPREHENSIVE METABOLIC PANEL
ALBUMIN: 3.6 g/dL (ref 3.5–5.0)
ALT: 18 U/L (ref 14–54)
AST: 21 U/L (ref 15–41)
Alkaline Phosphatase: 63 U/L (ref 38–126)
Anion gap: 7 (ref 5–15)
BUN: 15 mg/dL (ref 6–20)
CALCIUM: 9.1 mg/dL (ref 8.9–10.3)
CO2: 25 mmol/L (ref 22–32)
Chloride: 107 mmol/L (ref 101–111)
Creatinine, Ser: 0.77 mg/dL (ref 0.44–1.00)
GFR calc non Af Amer: >60 mL/min (ref >60)
GLUCOSE: 102 mg/dL — AB (ref 65–99)
POTASSIUM: 3.8 mmol/L (ref 3.5–5.1)
SODIUM: 139 mmol/L (ref 135–145)
TOTAL PROTEIN: 7 g/dL (ref 6.5–8.1)
Total Bilirubin: 0.2 mg/dL — ABNORMAL LOW (ref 0.3–1.2)

## 2016-11-17 LAB — PROTIME-INR
INR: 0.98
PROTHROMBIN TIME: 12.9 s (ref 11.4–15.2)

## 2016-11-17 LAB — HCG, SERUM, QUALITATIVE: PREG SERUM: NEGATIVE

## 2016-11-17 NOTE — Pre-Procedure Instructions (Addendum)
Katherine Baldwin  11/17/2016      [id number] - [place], [place] - [address] AT [facility] OF [facility] & [address] [place] [address] Phone: [phone] Fax: [phone]  [id number] - [place], [place] - [address] AT [facility] OF [facility] [address] [place] [address] Phone: [phone] Fax: [phone]    Your procedure is scheduled on November 19, 2016.  Report to [facility] Admitting at 630 AM.  Call this number if you have problems the morning of surgery:  [phone]   Remember:  Do not eat food or drink liquids after midnight.  Take these medicines the morning of surgery with A SIP OF WATER cetirizine (zyrtec)-if needed, omeprazole (prilosec).  Starting today 11/17/16  STOP taking any Aspirin, Aleve, Naproxen, Ibuprofen, Motrin, Advil, Goody's, BC's, all herbal medications, fish oil, and all vitamins,pennsaid   Do not wear jewelry, make-up or nail polish.  Do not wear lotions, powders, or perfumes, or deoderant.  Do not shave 48 hours prior to surgery.  Men may shave face and neck.  Do not bring valuables to the hospital.  [facility] is not responsible for any belongings or valuables.  Contacts, dentures or bridgework may not be worn into surgery.  Leave your suitcase in the car.  After surgery it may be brought to your room.  For patients admitted to the hospital, discharge time will be determined by your treatment team.  Patients discharged the day of surgery will not be allowed to drive home.    Special instructions:   [place]- Preparing For Surgery  Before surgery, you can play an important role. Because skin is not sterile, your skin needs to be as free of germs as possible. You can reduce the number of germs on your skin by washing with CHG (chlorahexidine gluconate) Soap before surgery.  CHG is an antiseptic cleaner which kills germs and bonds with  the skin to continue killing germs even after washing.  Please do not use if you have an allergy to CHG or antibacterial soaps. If your skin becomes reddened/irritated stop using the CHG.  Do not shave (including legs and underarms) for at least 48 hours prior to first CHG shower. It is OK to shave your face.  Please follow these instructions carefully.   1. Shower the NIGHT BEFORE SURGERY and the MORNING OF SURGERY with CHG.   2. If you chose to wash your hair, wash your hair first as usual with your normal shampoo.  3. After you shampoo, rinse your hair and body thoroughly to remove the shampoo.  4. Use CHG as you would any other liquid soap. You can apply CHG directly to the skin and wash gently with a scrungie or a clean washcloth.   5. Apply the CHG Soap to your body ONLY FROM THE NECK DOWN.  Do not use on open wounds or open sores. Avoid contact with your eyes, ears, mouth and genitals (private parts). Wash genitals (private parts) with your normal soap.  6. Wash thoroughly, paying special attention to the area where your surgery will be performed.  7. Thoroughly rinse your body with warm water from the neck down.  8. DO NOT shower/wash with your normal soap after using and rinsing off the CHG Soap.  9. Pat yourself dry with a CLEAN TOWEL.   10. Wear CLEAN PAJAMAS   11. Place CLEAN SHEETS  on your bed the night of your first shower and DO NOT SLEEP WITH PETS.    Day of Surgery: Do not apply any deodorants/lotions. Please wear clean clothes to the hospital/surgery center.     Please read over the following fact sheets that you were given.

## 2016-11-17 NOTE — H&P (Signed)
Katherine Baldwin Location: [facility] Patient #: [id number] DOB: 22-Jun-1975 Married / Language: English / Race: Black or African American Female   History of Present Illness The patient is a 41 year old female who presents for a follow-up for Gall stones. Patient is a 41 year old female referred by Dr. Loreta Ave for consultation regarding symptomatic cholelithiasis during pregnancy two years ago.   She was pretty miserable the last month to 2 months before delivery. She ended up having her baby relatively precipitously at 37 weeks. She was induced, but the labor came on extremely quickly without a nurse or doctor being able to get to the room in time. She is also status post laparoscopic gastric sleeve which places her at higher risk for having gallbladder related issues as well. Additionally, every one of her family members per generation or the one before have had cholecystectomy.   She did a bit better over the last few years by watching her diet. Unfortunately, she is starting to have episodes of severe pain accompanied by chills and nausea/vomiting that are more frequent. She states that the pain is sometimes epigastric and sometimes more right lateral abdomen. She denies jaundice.   06/26/2014 IMPRESSION: 1. Intense hypermetabolic activity in the right posterior auricular lesion and along the deep right parotid lymph node, suspicious for melanoma. 2. Low-grade hypermetabolic activity in a variety of enlarged lymph nodes including the superficial lateral cervical node on the left ; left supraclavicular node; subcarinal, paratracheal, and prevascular lymph nodes in the chest ; porta hepatis and external iliac lymph nodes; and a subcutaneous lymph node anterior to the left lateral abdominal wall. As opposed to the high-grade activity in the right posterior auricular lesion, low-level activity in these lymph nodes indicates that well abnormal these could conceivably  represent a separate process such as lymphoproliferative disease rather than necessarily representing metastatic melanoma. Tissue diagnosis may be helpful in this regard. 3. Stellate/infiltrative abnormal accentuated activity in the dome of the liver, SUV measuring up to 8.0, concerning for infiltrative malignancy. 4. Patchy airspace opacity in the right upper lobe is moderately hypermetabolic. Morphologically the appearance suggests pneumonia, follow up chest radiography may be warranted to ensure resolution. 5. Mild cardiomegaly. Coronary atherosclerosis. 6. Aortic Atherosclerosis (ICD10-I70.0). 7. Prominent stool throughout the colon favors constipation. 8. Considerable asymmetric arthropathy of the right hip with associated synovitis and accentuated activity which is most likely benign. Correlate with patient history in determining whether further workup is indicated, in particular if the patient is having acute hip symptoms then workup to exclude septic joint may be indicated.   Allergies  Peanut Oil *CHEMICALS*  Itching.  Medication History  Omeprazole (  Capsule DR, Oral) Active. Multivitamin Adult (Oral daily) Active. Medications Reconciled    Review of Systems All other systems negative  Vitals  Weight: 339.13 lb Height: 60in Body Surface Area: 2.34 m Body Mass Index: 66.23 kg/m  Temp.: 34F(Oral)  Pulse: 91 (Regular)  BP: 118/80 (Sitting, Left Arm, Standard)       Physical Exam  General Mental Status-Alert. General Appearance-Consistent with stated age. Hydration-Well hydrated. Voice-Normal.  Head and Neck Head-normocephalic, atraumatic with no lesions or palpable masses.  Eye Sclera/Conjunctiva - Bilateral-No scleral icterus.  Chest and Lung Exam Chest and lung exam reveals -quiet, even and easy respiratory effort with no use of accessory muscles. Inspection Chest Wall - Normal. Back - normal.  Breast -  Did not examine.  Cardiovascular Cardiovascular examination reveals -normal pedal pulses bilaterally. Note: regular rate and rhythm  Abdomen Inspection-Inspection Normal. Palpation/Percussion Palpation and Percussion of the abdomen reveal - Soft, No Rebound tenderness, No Rigidity (guarding) and No hepatosplenomegaly. Note: mild RUQ tenderness.  Peripheral Vascular Upper Extremity Inspection - Bilateral - Normal - No Clubbing, No Cyanosis, No Edema, Pulses Intact. Lower Extremity Palpation - Edema - Bilateral - No edema.  Neurologic Neurologic evaluation reveals -alert and oriented x 3 with no impairment of recent or remote memory. Mental Status-Normal.  Musculoskeletal Global Assessment -Note: no gross deformities.  Normal Exam - Left-Upper Extremity Strength Normal and Lower Extremity Strength Normal. Normal Exam - Right-Upper Extremity Strength Normal and Lower Extremity Strength Normal.  Lymphatic Head & Neck  General Head & Neck Lymphatics: Bilateral - Description - Normal. Axillary  General Axillary Region: Bilateral - Description - Normal. Tenderness - Non Tender.    Assessment & Plan CHRONIC CHOLECYSTITIS WITH CALCULUS (K80.10) Impression: Patient is having worsening symptoms of gallbladder disease. She does desire cholecystectomy now. The symptoms are interfering with her ability to work. I reviewed surgery with the patient as well as risks and benefits of the surgery. She understands and wishes to proceed. The surgical procedure was described to the patient in detail. The patient was given educational material. I discussed the incision type and location, the location of the gallbladder, the anatomy of the bile ducts and arteries, and the typical progression of surgery. I discussed the possibility of converting to an open operation. I advised of the risks of bleeding, infection, damage to other structures (such as the bile duct, intestine or liver), bile  leak, need for other procedures or surgeries, and post op diarrhea/constipation. We discussed the risk of blood clot. We discussed the recovery period and post operative restrictions. The patient was advised against taking blood thinners the week before surgery. Current Plans Schedule for Surgery Pt Education - Laparoscopic Cholecystectomy: gallbladder   Signed by Almond LintFaera Kaulana Brindle, MD

## 2016-11-18 MED ORDER — DEXTROSE 5 % IV SOLN
3.0000 g | INTRAVENOUS | Status: AC
  Administered 2016-11-19: 3 g via INTRAVENOUS
  Filled 2016-11-18: qty 3000

## 2016-11-18 NOTE — Anesthesia Preprocedure Evaluation (Addendum)
Anesthesia Evaluation  Patient identified by MRN, date of birth, ID band Patient awake    Reviewed: Allergy & Precautions, H&P , NPO status , Patient's Chart, lab work & pertinent test results  Airway Mallampati: I  TM Distance: >3 FB Neck ROM: Full    Dental no notable dental hx. (+) Teeth Intact, Dental Advisory Given   Pulmonary neg pulmonary ROS, former smoker,    Pulmonary exam normal breath sounds clear to auscultation       Cardiovascular Exercise Tolerance: Good negative cardio ROS   Rhythm:Regular Rate:Normal     Neuro/Psych  Headaches, negative psych ROS   GI/Hepatic Neg liver ROS, GERD  Medicated and Controlled,  Endo/Other  Morbid obesity  Renal/GU negative Renal ROS  negative genitourinary   Musculoskeletal  (+) Arthritis , Osteoarthritis,    Abdominal   Peds  Hematology negative hematology ROS (+)   Anesthesia Other Findings   Reproductive/Obstetrics negative OB ROS                            Anesthesia Physical Anesthesia Plan  ASA: III  Anesthesia Plan: General   Post-op Pain Management:    Induction: Intravenous  PONV Risk Score and Plan: 4 or greater and Ondansetron, Dexamethasone and Midazolam  Airway Management Planned: Oral ETT  Additional Equipment:   Intra-op Plan:   Post-operative Plan: Extubation in OR  Informed Consent: I have reviewed the patients History and Physical, chart, labs and discussed the procedure including the risks, benefits and alternatives for the proposed anesthesia with the patient or authorized representative who has indicated his/her understanding and acceptance.   Dental advisory given  Plan Discussed with: CRNA  Anesthesia Plan Comments:        Anesthesia Quick Evaluation

## 2016-11-19 ENCOUNTER — Encounter (HOSPITAL_COMMUNITY): Payer: Self-pay

## 2016-11-19 ENCOUNTER — Encounter (HOSPITAL_COMMUNITY)
Admission: RE | Disposition: A | Discharge status: Home / Self Care | Payer: Self-pay | Source: Ambulatory Visit | Attending: General Surgery

## 2016-11-19 ENCOUNTER — Ambulatory Visit (HOSPITAL_COMMUNITY)
Admission: RE | Admit: 2016-11-19 | Discharge: 2016-11-19 | Disposition: A | Discharge status: Home / Self Care | Payer: BLUE CROSS/BLUE SHIELD | Source: Ambulatory Visit | Attending: General Surgery | Admitting: General Surgery

## 2016-11-19 ENCOUNTER — Ambulatory Visit (HOSPITAL_COMMUNITY): Payer: BLUE CROSS/BLUE SHIELD | Admitting: Anesthesiology

## 2016-11-19 DIAGNOSIS — K219 Gastro-esophageal reflux disease without esophagitis: Secondary | ICD-10-CM | POA: Diagnosis not present

## 2016-11-19 DIAGNOSIS — K8010 Calculus of gallbladder with chronic cholecystitis without obstruction: Secondary | ICD-10-CM | POA: Diagnosis not present

## 2016-11-19 DIAGNOSIS — Z9884 Bariatric surgery status: Secondary | ICD-10-CM | POA: Insufficient documentation

## 2016-11-19 DIAGNOSIS — I2510 Atherosclerotic heart disease of native coronary artery without angina pectoris: Secondary | ICD-10-CM | POA: Diagnosis not present

## 2016-11-19 DIAGNOSIS — K8080 Other cholelithiasis without obstruction: Secondary | ICD-10-CM | POA: Diagnosis not present

## 2016-11-19 DIAGNOSIS — Z6841 Body Mass Index (BMI) 40.0 and over, adult: Secondary | ICD-10-CM | POA: Insufficient documentation

## 2016-11-19 DIAGNOSIS — I700 Atherosclerosis of aorta: Secondary | ICD-10-CM | POA: Insufficient documentation

## 2016-11-19 DIAGNOSIS — N946 Dysmenorrhea, unspecified: Secondary | ICD-10-CM | POA: Diagnosis not present

## 2016-11-19 DIAGNOSIS — I517 Cardiomegaly: Secondary | ICD-10-CM | POA: Diagnosis not present

## 2016-11-19 DIAGNOSIS — N920 Excessive and frequent menstruation with regular cycle: Secondary | ICD-10-CM | POA: Diagnosis not present

## 2016-11-19 DIAGNOSIS — Z87891 Personal history of nicotine dependence: Secondary | ICD-10-CM | POA: Insufficient documentation

## 2016-11-19 HISTORY — PX: CHOLECYSTECTOMY: SHX55

## 2016-11-19 SURGERY — LAPAROSCOPIC CHOLECYSTECTOMY WITH INTRAOPERATIVE CHOLANGIOGRAM
Anesthesia: General | Site: Abdomen

## 2016-11-19 MED ORDER — GLYCOPYRROLATE 0.2 MG/ML IV SOSY
PREFILLED_SYRINGE | INTRAVENOUS | Status: DC | PRN
  Administered 2016-11-19: 0.8 mg via INTRAVENOUS

## 2016-11-19 MED ORDER — SUCCINYLCHOLINE CHLORIDE 200 MG/10ML IV SOSY
PREFILLED_SYRINGE | INTRAVENOUS | Status: AC
  Filled 2016-11-19: qty 10

## 2016-11-19 MED ORDER — LIDOCAINE HCL (PF) 1 % IJ SOLN
INTRAMUSCULAR | Status: AC
  Filled 2016-11-19: qty 30

## 2016-11-19 MED ORDER — NEOSTIGMINE METHYLSULFATE 5 MG/5ML IV SOSY
PREFILLED_SYRINGE | INTRAVENOUS | Status: AC
  Filled 2016-11-19: qty 5

## 2016-11-19 MED ORDER — ONDANSETRON HCL 4 MG/2ML IJ SOLN
INTRAMUSCULAR | Status: DC | PRN
  Administered 2016-11-19: 4 mg via INTRAVENOUS

## 2016-11-19 MED ORDER — HYDROMORPHONE HCL 1 MG/ML IJ SOLN
INTRAMUSCULAR | Status: AC
  Filled 2016-11-19: qty 1

## 2016-11-19 MED ORDER — FENTANYL CITRATE (PF) 250 MCG/5ML IJ SOLN
INTRAMUSCULAR | Status: AC
  Filled 2016-11-19: qty 5

## 2016-11-19 MED ORDER — LIDOCAINE HCL 1 % IJ SOLN
INTRAMUSCULAR | Status: DC | PRN
  Administered 2016-11-19: 18 mL via INTRAMUSCULAR

## 2016-11-19 MED ORDER — CHLORHEXIDINE GLUCONATE CLOTH 2 % EX PADS: 6.0000 | MEDICATED_PAD | Freq: Once | CUTANEOUS | Status: DC

## 2016-11-19 MED ORDER — IOPAMIDOL (ISOVUE-300) INJECTION 61%
INTRAVENOUS | Status: AC
  Filled 2016-11-19: qty 50

## 2016-11-19 MED ORDER — MIDAZOLAM HCL 2 MG/2ML IJ SOLN
INTRAMUSCULAR | Status: AC
  Filled 2016-11-19: qty 2

## 2016-11-19 MED ORDER — 0.9 % SODIUM CHLORIDE (POUR BTL) OPTIME
TOPICAL | Status: DC | PRN
  Administered 2016-11-19: 1000 mL

## 2016-11-19 MED ORDER — HYDROMORPHONE HCL 1 MG/ML IJ SOLN
0.2500 mg | INTRAMUSCULAR | Status: DC | PRN
  Administered 2016-11-19: 0.25 mg via INTRAVENOUS

## 2016-11-19 MED ORDER — LIDOCAINE 2% (20 MG/ML) 5 ML SYRINGE
INTRAMUSCULAR | Status: DC | PRN
  Administered 2016-11-19: 60 mg via INTRAVENOUS

## 2016-11-19 MED ORDER — LACTATED RINGERS IV SOLN
INTRAVENOUS | Status: DC | PRN
  Administered 2016-11-19: 08:00:00 via INTRAVENOUS

## 2016-11-19 MED ORDER — FENTANYL CITRATE (PF) 100 MCG/2ML IJ SOLN
INTRAMUSCULAR | Status: DC | PRN
  Administered 2016-11-19 (×2): 50 ug via INTRAVENOUS
  Administered 2016-11-19 (×2): 100 ug via INTRAVENOUS
  Administered 2016-11-19 (×4): 50 ug via INTRAVENOUS

## 2016-11-19 MED ORDER — PROPOFOL 10 MG/ML IV BOLUS
INTRAVENOUS | Status: DC | PRN
  Administered 2016-11-19: 200 mg via INTRAVENOUS

## 2016-11-19 MED ORDER — MIDAZOLAM HCL 5 MG/5ML IJ SOLN
INTRAMUSCULAR | Status: DC | PRN
  Administered 2016-11-19: 2 mg via INTRAVENOUS

## 2016-11-19 MED ORDER — LIDOCAINE 2% (20 MG/ML) 5 ML SYRINGE
INTRAMUSCULAR | Status: AC
  Filled 2016-11-19: qty 5

## 2016-11-19 MED ORDER — SUCCINYLCHOLINE CHLORIDE 20 MG/ML IJ SOLN
INTRAMUSCULAR | Status: DC | PRN
  Administered 2016-11-19: 120 mg via INTRAVENOUS

## 2016-11-19 MED ORDER — ONDANSETRON HCL 4 MG/2ML IJ SOLN
INTRAMUSCULAR | Status: AC
  Filled 2016-11-19: qty 2

## 2016-11-19 MED ORDER — OXYCODONE HCL 5 MG PO TABS: 5.0000 mg | ORAL_TABLET | Freq: Four times a day (QID) | ORAL | 0 refills | Status: DC | PRN

## 2016-11-19 MED ORDER — ROCURONIUM BROMIDE 10 MG/ML (PF) SYRINGE
PREFILLED_SYRINGE | INTRAVENOUS | Status: DC | PRN
  Administered 2016-11-19: 50 mg via INTRAVENOUS
  Administered 2016-11-19: 20 mg via INTRAVENOUS

## 2016-11-19 MED ORDER — DEXAMETHASONE SODIUM PHOSPHATE 10 MG/ML IJ SOLN
INTRAMUSCULAR | Status: AC
  Filled 2016-11-19: qty 1

## 2016-11-19 MED ORDER — ROCURONIUM BROMIDE 10 MG/ML (PF) SYRINGE
PREFILLED_SYRINGE | INTRAVENOUS | Status: AC
  Filled 2016-11-19: qty 5

## 2016-11-19 MED ORDER — BUPIVACAINE-EPINEPHRINE (PF) 0.25% -1:200000 IJ SOLN
INTRAMUSCULAR | Status: AC
  Filled 2016-11-19: qty 30

## 2016-11-19 MED ORDER — NEOSTIGMINE METHYLSULFATE 5 MG/5ML IV SOSY
PREFILLED_SYRINGE | INTRAVENOUS | Status: DC | PRN
  Administered 2016-11-19: 5 mg via INTRAVENOUS

## 2016-11-19 MED ORDER — DEXAMETHASONE SODIUM PHOSPHATE 10 MG/ML IJ SOLN
INTRAMUSCULAR | Status: DC | PRN
  Administered 2016-11-19: 10 mg via INTRAVENOUS

## 2016-11-19 MED ORDER — SODIUM CHLORIDE 0.9 % IR SOLN
Status: DC | PRN
Start: 2016-11-19 — End: 2016-11-19
  Administered 2016-11-19: 1000 mL

## 2016-11-19 MED ORDER — PROPOFOL 10 MG/ML IV BOLUS
INTRAVENOUS | Status: AC
  Filled 2016-11-19: qty 20

## 2016-11-19 SURGICAL SUPPLY — 49 items
APPLIER CLIP ROT 10 11.4 M/L (STAPLE) ×2
BLADE CLIPPER SURG (BLADE) IMPLANT
CANISTER SUCT 3000ML PPV (MISCELLANEOUS) ×2 IMPLANT
CHLORAPREP W/TINT 26ML (MISCELLANEOUS) ×2 IMPLANT
CLIP APPLIE ROT 10 11.4 M/L (STAPLE) ×1 IMPLANT
COVER MAYO STAND STRL (DRAPES) ×2 IMPLANT
COVER SURGICAL LIGHT HANDLE (MISCELLANEOUS) ×2 IMPLANT
DECANTER SPIKE VIAL GLASS SM (MISCELLANEOUS) ×2 IMPLANT
DERMABOND ADVANCED (GAUZE/BANDAGES/DRESSINGS) ×1
DERMABOND ADVANCED .7 DNX12 (GAUZE/BANDAGES/DRESSINGS) ×1 IMPLANT
DRAPE C-ARM 42X72 X-RAY (DRAPES) IMPLANT
DRAPE WARM FLUID 44X44 (DRAPE) IMPLANT
ELECT REM PT RETURN 9FT ADLT (ELECTROSURGICAL) ×2
ELECTRODE REM PT RTRN 9FT ADLT (ELECTROSURGICAL) ×1 IMPLANT
FILTER SMOKE EVAC LAPAROSHD (FILTER) IMPLANT
GLOVE BIO SURGEON STRL SZ 6 (GLOVE) ×4 IMPLANT
GLOVE BIOGEL PI IND STRL 6.5 (GLOVE) ×2 IMPLANT
GLOVE BIOGEL PI IND STRL 7.0 (GLOVE) ×1 IMPLANT
GLOVE BIOGEL PI INDICATOR 6.5 (GLOVE) ×2
GLOVE BIOGEL PI INDICATOR 7.0 (GLOVE) ×1
GLOVE ECLIPSE 6.0 STRL STRAW (GLOVE) ×4 IMPLANT
GLOVE INDICATOR 7.0 STRL GRN (GLOVE) ×2 IMPLANT
GOWN BRE IMP SLV AUR LG STRL (GOWN DISPOSABLE) ×2 IMPLANT
GOWN STRL REUS W/ TWL LRG LVL3 (GOWN DISPOSABLE) ×2 IMPLANT
GOWN STRL REUS W/TWL 2XL LVL3 (GOWN DISPOSABLE) ×2 IMPLANT
GOWN STRL REUS W/TWL LRG LVL3 (GOWN DISPOSABLE) ×2
KIT BASIN OR (CUSTOM PROCEDURE TRAY) ×2 IMPLANT
KIT ROOM TURNOVER OR (KITS) ×2 IMPLANT
L-HOOK LAP DISP 36CM (ELECTROSURGICAL) ×2
LHOOK LAP DISP 36CM (ELECTROSURGICAL) ×1 IMPLANT
NS IRRIG 1000ML POUR BTL (IV SOLUTION) ×2 IMPLANT
PAD ARMBOARD 7.5X6 YLW CONV (MISCELLANEOUS) ×2 IMPLANT
PENCIL BUTTON HOLSTER BLD 10FT (ELECTRODE) IMPLANT
POUCH SPECIMEN RETRIEVAL 10MM (ENDOMECHANICALS) ×2 IMPLANT
SCISSORS LAP 5X35 DISP (ENDOMECHANICALS) ×2 IMPLANT
SET CHOLANGIOGRAPH 5 50 .035 (SET/KITS/TRAYS/PACK) IMPLANT
SET IRRIG TUBING LAPAROSCOPIC (IRRIGATION / IRRIGATOR) ×2 IMPLANT
SLEEVE ENDOPATH XCEL 5M (ENDOMECHANICALS) ×2 IMPLANT
SPECIMEN JAR SMALL (MISCELLANEOUS) ×2 IMPLANT
SUT MNCRL AB 4-0 PS2 18 (SUTURE) ×2 IMPLANT
SUT VICRYL 0 UR6 27IN ABS (SUTURE) ×4 IMPLANT
TOWEL OR 17X24 6PK STRL BLUE (TOWEL DISPOSABLE) ×2 IMPLANT
TOWEL OR 17X26 10 PK STRL BLUE (TOWEL DISPOSABLE) ×2 IMPLANT
TRAY LAPAROSCOPIC MC (CUSTOM PROCEDURE TRAY) ×2 IMPLANT
TROCAR 12M 150ML BLUNT (TROCAR) ×2 IMPLANT
TROCAR XCEL BLUNT TIP 100MML (ENDOMECHANICALS) ×2 IMPLANT
TROCAR XCEL NON-BLD 11X100MML (ENDOMECHANICALS) ×2 IMPLANT
TROCAR XCEL NON-BLD 5MMX100MML (ENDOMECHANICALS) ×2 IMPLANT
TUBING INSUFFLATION (TUBING) ×2 IMPLANT

## 2016-11-19 NOTE — Interval H&P Note (Signed)
History and Physical Interval Note:  11/19/2016 7:19 AM  Katherine Baldwin  has presented today for surgery, with the diagnosis of chronic cholecystitis with calculous  The various methods of treatment have been discussed with the patient and family. After consideration of risks, benefits and other options for treatment, the patient has consented to  Procedure(s): LAPAROSCOPIC CHOLECYSTECTOMY WITH INTRAOPERATIVE CHOLANGIOGRAM (N/A) as a surgical intervention .  The patient's history has been reviewed, patient examined, no change in status, stable for surgery.  I have reviewed the patient's chart and labs.  Questions were answered to the patient's satisfaction.     Madilyn Cephas

## 2016-11-19 NOTE — Transfer of Care (Signed)
Immediate Anesthesia Transfer of Care Note  Patient: Katherine Baldwin  Procedure(s) Performed: Procedure(s): LAPAROSCOPIC CHOLECYSTECTOMY WITH INTRAOPERATIVE CHOLANGIOGRAM (N/A)  Patient Location: PACU  Anesthesia Type:General  Level of Consciousness: awake, patient cooperative and responds to stimulation  Airway & Oxygen Therapy: Patient Spontanous Breathing and Patient connected to face mask oxygen  Post-op Assessment: Report given to RN and Post -op Vital signs reviewed and stable  Post vital signs: Reviewed and stable  Last Vitals:  Vitals:   11/19/16 0712  BP: (!) 116/48  Pulse: 69  Resp: 18  Temp: 36.7 C  SpO2: 100%    Last Pain:  Vitals:   11/19/16 0728  TempSrc:   PainSc: 2          Complications: No apparent anesthesia complications

## 2016-11-19 NOTE — Op Note (Signed)
Laparoscopic Cholecystectomy  Indications: This patient presents with symptomatic cholelithiasis and will undergo laparoscopic cholecystectomy.  Pre-operative Diagnosis: symptomatic cholelithiasis  Post-operative Diagnosis: Same and chronic cholecystitis  Surgeon: ZOXWRU,EAVWUBYERLY,Jaselynn Tamas   Assistants: Phylliss Blakeshelsea Connor, MD  Anesthesia: General endotracheal anesthesia and local  ASA Class: 3  Procedure Details  The patient was seen again in the Holding Room. The risks, benefits, complications, treatment options, and expected outcomes were discussed with the patient. The possibilities of  bleeding, recurrent infection, damage to nearby structures, the need for additional procedures, failure to diagnose a condition, the possible need to convert to an open procedure, and creating a complication requiring transfusion or operation were discussed with the patient. The likelihood of improving the patient's symptoms with return to their baseline status is good.    The patient and/or family concurred with the proposed plan, giving informed consent. The site of surgery properly noted. The patient was taken to Operating Room, and the procedure verified as Laparoscopic Cholecystectomy with possible Intraoperative Cholangiogram. A Time Out was held and the above information confirmed.  Prior to the induction of general anesthesia, antibiotic prophylaxis was administered. General endotracheal anesthesia was then administered and tolerated well. After the induction, the abdomen was prepped with Chloraprep and draped in the sterile fashion. The patient was positioned in the supine position.  Local anesthetic agent was injected into the skin near the umbilicus and an incision made. We dissected down to the abdominal fascia with blunt dissection.  The fascia was incised vertically and we entered the peritoneal cavity bluntly.  A pursestring suture of 0-Vicryl was placed around the fascial opening.  The Hasson cannula was  inserted and secured with the stay suture.  Unfortunately, the Hasson was a bit too short to stay intraperitoneal.  The long 12 mm port was used and held in place with the purse string suture.  Pneumoperitoneum was then created with CO2 and tolerated well without any adverse changes in the patient's vital signs. An 11-mm port was placed in the subxiphoid position.  Two 5-mm ports were placed in the right upper quadrant. All skin incisions were infiltrated with a local anesthetic agent before making the incision and placing the trocars.   We positioned the patient in reverse Trendelenburg, tilted slightly to the patient's left.  The gallbladder was identified, the fundus grasped and retracted cephalad. Adhesions were lysed bluntly and with the electrocautery where indicated, taking care not to injure any adjacent organs or viscus. The infundibulum was grasped and retracted laterally, exposing the peritoneum overlying the triangle of Calot. This was then divided and exposed in a blunt fashion. A critical view of the cystic duct and cystic artery was obtained. The cystic artery was anterior and was doubly clipped proximally and singly clipped distally.   The cystic duct was clearly identified and bluntly dissected circumferentially. The cystic duct was ligated with a clip distally and three clips proximally.    The gallbladder was dissected from the liver bed in retrograde fashion with the electrocautery. The gallbladder was removed and placed in an Endocatch bag.  The gallbladder and Endocatch bag were then removed through the umbilical port site.  The liver bed was irrigated and inspected. Hemostasis was achieved with the electrocautery. Copious irrigation was utilized and was repeatedly aspirated until clear.    We again inspected the right upper quadrant for hemostasis.  Pneumoperitoneum was released as we removed the trocars.   The pursestring suture was used to close the umbilical fascia.  4-0 Monocryl was  used to close the skin.   The skin was cleaned and dry, and Dermabond was applied. The patient was then extubated and brought to the recovery room in stable condition. Instrument, sponge, and needle counts were correct at closure and at the conclusion of the case.   Findings: Chronic inflammation, numerous stones.    Estimated Blood Loss: min         Drains: none.          Specimens: Gallbladder to pathology       Complications: None; patient tolerated the procedure well.         Disposition: PACU - hemodynamically stable.         Condition: stable

## 2016-11-19 NOTE — Anesthesia Postprocedure Evaluation (Signed)
Anesthesia Post Note  Patient: Azucena CecilMarian M Dominique  Procedure(s) Performed: Procedure(s) (LRB): LAPAROSCOPIC CHOLECYSTECTOMY WITH INTRAOPERATIVE CHOLANGIOGRAM (N/A)     Patient location during evaluation: PACU Anesthesia Type: General Level of consciousness: awake and alert Pain management: pain level controlled Vital Signs Assessment: post-procedure vital signs reviewed and stable Respiratory status: spontaneous breathing, nonlabored ventilation and respiratory function stable Cardiovascular status: blood pressure returned to baseline and stable Postop Assessment: no signs of nausea or vomiting Anesthetic complications: no    Last Vitals:  Vitals:   11/19/16 1035 11/19/16 1050  BP: 119/70 111/64  Pulse: (!) 55 (!) 52  Resp: (!) 23   Temp:  (!) 36.4 C  SpO2: (!) 89% 93%    Last Pain:  Vitals:   11/19/16 1115  TempSrc:   PainSc: Asleep                 Zeena Starkel,W. EDMOND

## 2016-11-19 NOTE — Discharge Instructions (Addendum)
[facility] Office Phone Number [phone]   POST OP INSTRUCTIONS  Always review your discharge instruction sheet given to you by the facility where your surgery was performed.  IF YOU HAVE DISABILITY OR FAMILY LEAVE FORMS, YOU MUST BRING THEM TO THE OFFICE FOR PROCESSING.  DO NOT GIVE THEM TO YOUR DOCTOR.  1. A prescription for pain medication may be given to you upon discharge.  Take your pain medication as prescribed, if needed.  If narcotic pain medicine is not needed, then you may take acetaminophen (Tylenol) or ibuprofen (Advil) as needed. 2. Take your usually prescribed medications unless otherwise directed 3. If you need a refill on your pain medication, please contact your pharmacy.  They will contact our office to request authorization.  Prescriptions will not be filled after 5pm or on week-ends. 4. You should eat very light the first 24 hours after surgery, such as soup, crackers, pudding, etc.  Resume your normal diet the day after surgery 5. It is common to experience some constipation if taking pain medication after surgery.  Increasing fluid intake and taking a stool softener will usually help or prevent this problem from occurring.  A mild laxative (Milk of Magnesia or Miralax) should be taken according to package directions if there are no bowel movements after 48 hours. 6. You may shower in 48 hours.  The surgical glue will flake off in 2-3 weeks.   7. ACTIVITIES:  No strenuous activity or heavy lifting for 2 week.   a. You may drive when you no longer are taking prescription pain medication, you can comfortably wear a seatbelt, and you can safely maneuver your car and apply brakes. b. RETURN TO WORK:  __________as tolerated 1-2 weeks as long as no lifting.  _______________ Bonita QuinYou should see your doctor in the office for a follow-up appointment approximately three-four weeks after your surgery.    WHEN TO CALL YOUR DOCTOR: 1. Fever over 101.0 2. Nausea and/or  vomiting. 3. Extreme swelling or bruising. 4. Continued bleeding from incision. 5. Increased pain, redness, or drainage from the incision.  The clinic staff is available to answer your questions during regular business hours.  Please dont hesitate to call and ask to speak to one of the nurses for clinical concerns.  If you have a medical emergency, go to the nearest emergency room or call 911.  [facility] always on call at the hospital.  For further questions, please visit [email or web address]

## 2016-11-20 ENCOUNTER — Encounter (HOSPITAL_COMMUNITY): Payer: Self-pay | Admitting: General Surgery

## 2016-11-26 ENCOUNTER — Emergency Department (HOSPITAL_COMMUNITY): Payer: BLUE CROSS/BLUE SHIELD

## 2016-11-26 ENCOUNTER — Encounter (HOSPITAL_COMMUNITY): Payer: Self-pay | Admitting: Emergency Medicine

## 2016-11-26 DIAGNOSIS — R002 Palpitations: Secondary | ICD-10-CM | POA: Diagnosis not present

## 2016-11-26 DIAGNOSIS — Z87891 Personal history of nicotine dependence: Secondary | ICD-10-CM | POA: Diagnosis not present

## 2016-11-26 DIAGNOSIS — I493 Ventricular premature depolarization: Secondary | ICD-10-CM | POA: Diagnosis not present

## 2016-11-26 DIAGNOSIS — Z91010 Allergy to peanuts: Secondary | ICD-10-CM | POA: Diagnosis not present

## 2016-11-26 DIAGNOSIS — R0789 Other chest pain: Secondary | ICD-10-CM | POA: Diagnosis not present

## 2016-11-26 LAB — CBC WITH DIFFERENTIAL/PLATELET
BASOS ABS: 0.1 10*3/uL (ref 0.0–0.1)
BASOS PCT: 1 %
EOS PCT: 2 %
Eosinophils Absolute: 0.2 10*3/uL (ref 0.0–0.7)
HCT: 35.6 % — ABNORMAL LOW (ref 36.0–46.0)
Hemoglobin: 11.2 g/dL — ABNORMAL LOW (ref 12.0–15.0)
Lymphocytes Relative: 30 %
Lymphs Abs: 2.4 10*3/uL (ref 0.7–4.0)
MCH: 25.7 pg — ABNORMAL LOW (ref 26.0–34.0)
MCHC: 31.5 g/dL (ref 30.0–36.0)
MCV: 81.8 fL (ref 78.0–100.0)
MONO ABS: 0.6 10*3/uL (ref 0.1–1.0)
Monocytes Relative: 8 %
Neutro Abs: 4.8 10*3/uL (ref 1.7–7.7)
Neutrophils Relative %: 59 %
PLATELETS: 304 10*3/uL (ref 150–400)
RBC: 4.35 MIL/uL (ref 3.87–5.11)
RDW: 14.6 % (ref 11.5–15.5)
WBC: 8.1 10*3/uL (ref 4.0–10.5)

## 2016-11-26 LAB — COMPREHENSIVE METABOLIC PANEL
ALBUMIN: 3.7 g/dL (ref 3.5–5.0)
ALT: 17 U/L (ref 14–54)
AST: 18 U/L (ref 15–41)
Alkaline Phosphatase: 72 U/L (ref 38–126)
Anion gap: 7 (ref 5–15)
BUN: 11 mg/dL (ref 6–20)
CHLORIDE: 104 mmol/L (ref 101–111)
CO2: 27 mmol/L (ref 22–32)
Calcium: 9.4 mg/dL (ref 8.9–10.3)
Creatinine, Ser: 0.83 mg/dL (ref 0.44–1.00)
GFR calc Af Amer: >60 mL/min (ref >60)
GLUCOSE: 123 mg/dL — AB (ref 65–99)
POTASSIUM: 4 mmol/L (ref 3.5–5.1)
Sodium: 138 mmol/L (ref 135–145)
Total Bilirubin: 0.5 mg/dL (ref 0.3–1.2)
Total Protein: 7.5 g/dL (ref 6.5–8.1)

## 2016-11-26 LAB — I-STAT BETA HCG BLOOD, ED (MC, WL, AP ONLY): I-stat hCG, quantitative: <5 m[IU]/mL (ref <5)

## 2016-11-26 LAB — I-STAT TROPONIN, ED: TROPONIN I, POC: 0 ng/mL (ref 0.00–0.08)

## 2016-11-26 NOTE — ED Triage Notes (Signed)
Patient reports central chest discomfort with palpitations onset last week with mild SOB , denies nausea or diaphoresis .

## 2016-11-27 ENCOUNTER — Emergency Department (HOSPITAL_COMMUNITY)
Admission: EM | Admit: 2016-11-27 | Discharge: 2016-11-27 | Disposition: A | Discharge status: Home / Self Care | Payer: BLUE CROSS/BLUE SHIELD | Attending: Emergency Medicine | Admitting: Emergency Medicine

## 2016-11-27 ENCOUNTER — Emergency Department (HOSPITAL_COMMUNITY): Payer: BLUE CROSS/BLUE SHIELD

## 2016-11-27 DIAGNOSIS — I493 Ventricular premature depolarization: Secondary | ICD-10-CM

## 2016-11-27 DIAGNOSIS — R0789 Other chest pain: Secondary | ICD-10-CM | POA: Diagnosis not present

## 2016-11-27 DIAGNOSIS — R002 Palpitations: Secondary | ICD-10-CM

## 2016-11-27 LAB — I-STAT TROPONIN, ED: Troponin i, poc: 0 ng/mL (ref 0.00–0.08)

## 2016-11-27 MED ORDER — SODIUM CHLORIDE 0.9 % IV BOLUS (SEPSIS)
1000.0000 mL | Freq: Once | INTRAVENOUS | Status: AC
  Administered 2016-11-27: 1000 mL via INTRAVENOUS

## 2016-11-27 MED ORDER — SIMETHICONE 80 MG PO CHEW
80.0000 mg | CHEWABLE_TABLET | Freq: Once | ORAL | Status: AC
  Administered 2016-11-27: 80 mg via ORAL
  Filled 2016-11-27: qty 1

## 2016-11-27 MED ORDER — IOPAMIDOL (ISOVUE-370) INJECTION 76%
INTRAVENOUS | Status: AC
  Administered 2016-11-27: 100 mL
  Filled 2016-11-27: qty 100

## 2016-11-27 NOTE — ED Notes (Signed)
MD at bedside. 

## 2016-11-27 NOTE — ED Provider Notes (Signed)
TIME SEEN: 1:50 AM  CHIEF COMPLAINT: Palpitations, shortness of breath  HPI: Patient is a 41 year old female with history of obesity who presents to the emergency department with 2 days of intermittent palpitations and shortness of breath. Patient is concerned about pulmonary embolus. She was just hospitalized for a cholecystectomy. She is not on any estrogen supplements, birth control. She is not a smoker. No history of CAD. Denies chest pain or chest discomfort. States she feels like her heart is "skipping a beat" and she has to catch her breath. No lower extremity swelling or pain. No history of PE or DVT.  ROS: See HPI Constitutional: no fever  Eyes: no drainage  ENT: no runny nose   Cardiovascular:  no chest pain  Resp:  SOB  GI: no vomiting GU: no dysuria Integumentary: no rash  Allergy: no hives  Musculoskeletal: no leg swelling  Neurological: no slurred speech ROS otherwise negative  PAST MEDICAL HISTORY/PAST SURGICAL HISTORY:  Past Medical History:  Diagnosis Date  . Abnormal Pap smear 1998   no treatment required  . Arthritis    knees  . Bronchitis    hx of  . Chicken pox   . Family history of anesthesia complication    N/V, extreme chills-father  . GERD (gastroesophageal reflux disease)    mild  . GERD (gastroesophageal reflux disease)   . 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 History of hiatal hernia   . Morbid obesity with body mass index of 60.0-69.9 in adult [facility])   . Multiple allergies   . Orbital pseudotumor   . Trichomonas     MEDICATIONS:  Prior to Admission medications   Medication Sig Start Date End Date Taking? Authorizing Provider  cetirizine (ZYRTEC) 10 MG tablet Take 10 mg by mouth daily as needed for allergies.    [provider]  Multiple Vitamins-Minerals (MULTIVITAMIN WITH MINERALS) tablet Take 1 tablet by mouth daily.    [provider]   omeprazole (PRILOSEC) 40 MG capsule Take 40 mg by mouth every morning.    [provider]  oxyCODONE (OXY IR/ROXICODONE) 5 MG immediate release tablet Take 1-2 tablets (5-10 mg total) by mouth every 6 (six) hours as needed for moderate pain, severe pain or breakthrough pain. 11/19/16   Almond Lint, MD  PENNSAID 2 % SOLN Apply 1 application topically 2 (two) times daily as needed. 11/10/16   [provider]    ALLERGIES:  Allergies  Allergen Reactions  . Fruit & Vegetable Daily [Nutritional Supplements]     Fruits and uncooked vegetables-itchy mouth and throat. Patient reports this is transient and she never knows which foods will have a rxn  . Peanuts [Peanut Oil] Itching    Itching in mouth- able to eat peanuts now    SOCIAL HISTORY:  Social History  Substance Use Topics  . Smoking status: Former Smoker    Packs/day: 0.00    Years: 0.00    Types: Cigarettes    Quit date: 03/10/2010  . Smokeless tobacco: Never Used  . Alcohol use No    FAMILY HISTORY: Family History  Problem Relation Age of Onset  . Diabetes Paternal Grandfather   . Diabetes Paternal Grandmother   . Diabetes Maternal Grandmother   . Diabetes Maternal Grandfather   . Breast cancer Father 39  . Arthritis Father        rhematoid  . Diabetes Father        Prediabetes  .  Prostate cancer Father 74  . Stomach cancer Father 13  . Cancer - Other Father 49       gallbladder  . Diabetes Mother   . Asthma Mother   . Hypertension Mother   . Fibroids Sister   . Thyroid disease Sister     EXAM: BP 117/80   Pulse 77   Temp 98.6 F (37 C) (Oral)   Resp 16   Ht  (1.676 m)   Wt (!) 154.7 kg (341 lb)   LMP 11/16/2016   SpO2 100%   BMI 55.04 kg/m  CONSTITUTIONAL: Alert and oriented and responds appropriately to questions. Well-appearing; well-nourished, Obese HEAD: Normocephalic EYES: Conjunctivae clear, pupils appear equal, EOMI ENT: normal nose; moist mucous membranes NECK: Supple,  no meningismus, no nuchal rigidity, no LAD  CARD: RRR; S1 and S2 appreciated; no murmurs, no clicks, no rubs, no gallops RESP: Normal chest excursion without splinting or tachypnea; breath sounds clear and equal bilaterally; no wheezes, no rhonchi, no rales, no hypoxia or respiratory distress, speaking full sentences ABD/GI: Normal bowel sounds; non-distended; soft, non-tender, no rebound, no guarding, no peritoneal signs, no hepatosplenomegaly BACK:  The back appears normal and is non-tender to palpation, there is no CVA tenderness EXT: Normal ROM in all joints; non-tender to palpation; no edema; normal capillary refill; no cyanosis, no calf tenderness or swelling    SKIN: Normal color for age and race; warm; no rash NEURO: Moves all extremities equally PSYCH: The patient's mood and manner are appropriate. Grooming and personal hygiene are appropriate.  MEDICAL DECISION MAKING: Patient here with palpitations and shortness of breath. She does have risk factors for PE including obesity, recent surgery and hospitalization. Given recent surgery, I do not feel obtaining a d-dimer is appropriate. We'll obtain CT of her chest for further evaluation. Labs ordered in triage are unremarkable including negative troponin. Chest x-ray clear. She is not pregnant.  ED PROGRESS: Patient's CT scan shows no pulmonary embolus. On the cardiac monitor she has had intermittent PVCs but no other arrhythmia. I think this is the cause of her symptoms. Have recommended increased water intake, avoiding caffeine. Second troponin here is negative. Doubt ACS. Doubt dissection. I feel she is safe to be discharged. She has a PCP for follow-up if symptoms continue. We have discussed return precautions.  At this time, I do not feel there is any life-threatening condition present. I have reviewed and discussed all results (EKG, imaging, lab, urine as appropriate) and exam findings with patient/family. I have reviewed nursing notes and  appropriate previous records.  I feel the patient is safe to be discharged home without further emergent workup and can continue workup as an outpatient as needed. Discussed usual and customary return precautions. Patient/family verbalize understanding and are comfortable with this plan.  Outpatient follow-up has been provided if needed. All questions have been answered.      EKG Interpretation  Date/Time:  Wednesday November 26 2016 20:53:22 EDT Ventricular Rate:  97 PR Interval:  162 QRS Duration: 82 QT Interval:  332 QTC Calculation: 421 R Axis:   15 Text Interpretation:  Normal sinus rhythm Possible Anterior infarct , age undetermined Abnormal ECG Confirmed by Senaya Dicenso, Baxter Hire [phone]) on 11/27/2016 1:50:44 AM         Elide Stalzer, Layla Maw, DO 11/27/16 0981

## 2016-11-27 NOTE — ED Notes (Signed)
Pt. Unable to sign due to technological issues

## 2016-11-27 NOTE — ED Notes (Signed)
Pt started have ongoing chest discomfort and palpitations with SOB last week. Recent cholecystectomy on 11/19/16 by Dr.Byerly

## 2016-11-27 NOTE — ED Notes (Signed)
IV established using ultrasound.  Needle visualized in the vein free from the wall.  Blood return noted and flushed without difficulty.  Patient tolerated procedure well.   

## 2016-12-19 DIAGNOSIS — Z23 Encounter for immunization: Secondary | ICD-10-CM | POA: Diagnosis not present

## 2016-12-22 DIAGNOSIS — F411 Generalized anxiety disorder: Secondary | ICD-10-CM | POA: Diagnosis not present

## 2017-01-06 DIAGNOSIS — F411 Generalized anxiety disorder: Secondary | ICD-10-CM | POA: Diagnosis not present

## 2017-01-14 DIAGNOSIS — F411 Generalized anxiety disorder: Secondary | ICD-10-CM | POA: Diagnosis not present

## 2017-01-26 DIAGNOSIS — F411 Generalized anxiety disorder: Secondary | ICD-10-CM | POA: Diagnosis not present

## 2017-02-04 DIAGNOSIS — F411 Generalized anxiety disorder: Secondary | ICD-10-CM | POA: Diagnosis not present

## 2017-02-24 DIAGNOSIS — F411 Generalized anxiety disorder: Secondary | ICD-10-CM | POA: Diagnosis not present

## 2017-03-26 DIAGNOSIS — F411 Generalized anxiety disorder: Secondary | ICD-10-CM | POA: Diagnosis not present

## 2017-03-30 ENCOUNTER — Encounter (HOSPITAL_COMMUNITY): Payer: Self-pay

## 2017-04-02 DIAGNOSIS — F411 Generalized anxiety disorder: Secondary | ICD-10-CM | POA: Diagnosis not present

## 2017-04-09 DIAGNOSIS — F411 Generalized anxiety disorder: Secondary | ICD-10-CM | POA: Diagnosis not present

## 2017-04-13 DIAGNOSIS — F411 Generalized anxiety disorder: Secondary | ICD-10-CM | POA: Diagnosis not present

## 2017-04-22 DIAGNOSIS — F411 Generalized anxiety disorder: Secondary | ICD-10-CM | POA: Diagnosis not present

## 2017-04-27 DIAGNOSIS — F411 Generalized anxiety disorder: Secondary | ICD-10-CM | POA: Diagnosis not present

## 2017-05-01 ENCOUNTER — Other Ambulatory Visit: Payer: Self-pay | Admitting: Obstetrics and Gynecology

## 2017-05-01 DIAGNOSIS — Z1231 Encounter for screening mammogram for malignant neoplasm of breast: Secondary | ICD-10-CM

## 2017-05-07 DIAGNOSIS — F411 Generalized anxiety disorder: Secondary | ICD-10-CM | POA: Diagnosis not present

## 2017-05-11 DIAGNOSIS — F411 Generalized anxiety disorder: Secondary | ICD-10-CM | POA: Diagnosis not present

## 2017-05-21 DIAGNOSIS — F411 Generalized anxiety disorder: Secondary | ICD-10-CM | POA: Diagnosis not present

## 2017-05-22 ENCOUNTER — Ambulatory Visit
Admission: RE | Admit: 2017-05-22 | Discharge: 2017-05-22 | Disposition: A | Discharge status: Home / Self Care | Payer: BLUE CROSS/BLUE SHIELD | Source: Ambulatory Visit | Attending: Obstetrics and Gynecology | Admitting: Obstetrics and Gynecology

## 2017-05-22 DIAGNOSIS — Z1231 Encounter for screening mammogram for malignant neoplasm of breast: Secondary | ICD-10-CM

## 2017-05-25 ENCOUNTER — Other Ambulatory Visit: Payer: Self-pay | Admitting: Obstetrics and Gynecology

## 2017-05-25 DIAGNOSIS — R928 Other abnormal and inconclusive findings on diagnostic imaging of breast: Secondary | ICD-10-CM

## 2017-05-28 DIAGNOSIS — F411 Generalized anxiety disorder: Secondary | ICD-10-CM | POA: Diagnosis not present

## 2017-06-01 DIAGNOSIS — F411 Generalized anxiety disorder: Secondary | ICD-10-CM | POA: Diagnosis not present

## 2017-06-02 ENCOUNTER — Ambulatory Visit
Admission: RE | Admit: 2017-06-02 | Discharge: 2017-06-02 | Disposition: A | Discharge status: Home / Self Care | Payer: BLUE CROSS/BLUE SHIELD | Source: Ambulatory Visit | Attending: Obstetrics and Gynecology | Admitting: Obstetrics and Gynecology

## 2017-06-02 ENCOUNTER — Other Ambulatory Visit: Payer: Self-pay | Admitting: Obstetrics and Gynecology

## 2017-06-02 DIAGNOSIS — R928 Other abnormal and inconclusive findings on diagnostic imaging of breast: Secondary | ICD-10-CM

## 2017-06-02 DIAGNOSIS — N6320 Unspecified lump in the left breast, unspecified quadrant: Secondary | ICD-10-CM

## 2017-06-03 ENCOUNTER — Ambulatory Visit
Admission: RE | Admit: 2017-06-03 | Discharge: 2017-06-03 | Disposition: A | Discharge status: Home / Self Care | Payer: BLUE CROSS/BLUE SHIELD | Source: Ambulatory Visit | Attending: Obstetrics and Gynecology | Admitting: Obstetrics and Gynecology

## 2017-06-03 DIAGNOSIS — N6320 Unspecified lump in the left breast, unspecified quadrant: Secondary | ICD-10-CM

## 2017-06-11 DIAGNOSIS — F411 Generalized anxiety disorder: Secondary | ICD-10-CM | POA: Diagnosis not present

## 2017-06-16 ENCOUNTER — Other Ambulatory Visit: Payer: Self-pay | Admitting: Obstetrics and Gynecology

## 2017-06-16 DIAGNOSIS — F411 Generalized anxiety disorder: Secondary | ICD-10-CM | POA: Diagnosis not present

## 2017-06-16 DIAGNOSIS — N63 Unspecified lump in unspecified breast: Secondary | ICD-10-CM

## 2017-06-22 DIAGNOSIS — F411 Generalized anxiety disorder: Secondary | ICD-10-CM | POA: Diagnosis not present

## 2017-06-30 ENCOUNTER — Ambulatory Visit
Admission: RE | Admit: 2017-06-30 | Discharge: 2017-06-30 | Disposition: A | Discharge status: Home / Self Care | Payer: BLUE CROSS/BLUE SHIELD | Source: Ambulatory Visit | Attending: Obstetrics and Gynecology | Admitting: Obstetrics and Gynecology

## 2017-06-30 DIAGNOSIS — N63 Unspecified lump in unspecified breast: Secondary | ICD-10-CM

## 2017-06-30 DIAGNOSIS — F411 Generalized anxiety disorder: Secondary | ICD-10-CM | POA: Diagnosis not present

## 2017-06-30 DIAGNOSIS — Z803 Family history of malignant neoplasm of breast: Secondary | ICD-10-CM | POA: Diagnosis not present

## 2017-06-30 MED ORDER — GADOBENATE DIMEGLUMINE 529 MG/ML IV SOLN
20.0000 mL | Freq: Once | INTRAVENOUS | Status: AC | PRN
  Administered 2017-06-30: 20 mL via INTRAVENOUS

## 2017-07-09 DIAGNOSIS — F411 Generalized anxiety disorder: Secondary | ICD-10-CM | POA: Diagnosis not present

## 2017-07-28 DIAGNOSIS — Z6841 Body Mass Index (BMI) 40.0 and over, adult: Secondary | ICD-10-CM | POA: Diagnosis not present

## 2017-07-28 DIAGNOSIS — Z124 Encounter for screening for malignant neoplasm of cervix: Secondary | ICD-10-CM | POA: Diagnosis not present

## 2017-07-28 DIAGNOSIS — Z01419 Encounter for gynecological examination (general) (routine) without abnormal findings: Secondary | ICD-10-CM | POA: Diagnosis not present

## 2017-07-29 DIAGNOSIS — N6321 Unspecified lump in the left breast, upper outer quadrant: Secondary | ICD-10-CM | POA: Diagnosis not present

## 2017-07-29 DIAGNOSIS — N63 Unspecified lump in unspecified breast: Secondary | ICD-10-CM | POA: Diagnosis not present

## 2017-07-30 DIAGNOSIS — F411 Generalized anxiety disorder: Secondary | ICD-10-CM | POA: Diagnosis not present

## 2017-08-04 DIAGNOSIS — F411 Generalized anxiety disorder: Secondary | ICD-10-CM | POA: Diagnosis not present

## 2017-08-07 DIAGNOSIS — N6321 Unspecified lump in the left breast, upper outer quadrant: Secondary | ICD-10-CM | POA: Diagnosis not present

## 2017-08-07 DIAGNOSIS — N62 Hypertrophy of breast: Secondary | ICD-10-CM | POA: Diagnosis not present

## 2017-08-07 DIAGNOSIS — N6320 Unspecified lump in the left breast, unspecified quadrant: Secondary | ICD-10-CM | POA: Diagnosis not present

## 2017-08-14 DIAGNOSIS — Z1231 Encounter for screening mammogram for malignant neoplasm of breast: Secondary | ICD-10-CM | POA: Diagnosis not present

## 2017-08-18 DIAGNOSIS — F411 Generalized anxiety disorder: Secondary | ICD-10-CM | POA: Diagnosis not present

## 2017-08-25 DIAGNOSIS — F411 Generalized anxiety disorder: Secondary | ICD-10-CM | POA: Diagnosis not present

## 2017-08-27 DIAGNOSIS — N8184 Pelvic muscle wasting: Secondary | ICD-10-CM | POA: Diagnosis not present

## 2017-08-31 DIAGNOSIS — F411 Generalized anxiety disorder: Secondary | ICD-10-CM | POA: Diagnosis not present

## 2017-09-18 DIAGNOSIS — R102 Pelvic and perineal pain: Secondary | ICD-10-CM | POA: Diagnosis not present

## 2017-09-18 DIAGNOSIS — Z68.41 Body mass index (BMI) pediatric, greater than or equal to 95th percentile for age: Secondary | ICD-10-CM | POA: Diagnosis not present

## 2017-12-15 DIAGNOSIS — F4322 Adjustment disorder with anxiety: Secondary | ICD-10-CM | POA: Diagnosis not present

## 2018-01-04 DIAGNOSIS — F4322 Adjustment disorder with anxiety: Secondary | ICD-10-CM | POA: Diagnosis not present

## 2018-01-19 DIAGNOSIS — F4322 Adjustment disorder with anxiety: Secondary | ICD-10-CM | POA: Diagnosis not present

## 2018-02-02 DIAGNOSIS — F4322 Adjustment disorder with anxiety: Secondary | ICD-10-CM | POA: Diagnosis not present

## 2018-05-14 DIAGNOSIS — Z1239 Encounter for other screening for malignant neoplasm of breast: Secondary | ICD-10-CM | POA: Diagnosis not present

## 2018-05-14 DIAGNOSIS — Z1231 Encounter for screening mammogram for malignant neoplasm of breast: Secondary | ICD-10-CM | POA: Diagnosis not present

## 2018-05-14 DIAGNOSIS — Z803 Family history of malignant neoplasm of breast: Secondary | ICD-10-CM | POA: Diagnosis not present

## 2018-05-14 DIAGNOSIS — Z1501 Genetic susceptibility to malignant neoplasm of breast: Secondary | ICD-10-CM | POA: Diagnosis not present

## 2018-10-07 DIAGNOSIS — J019 Acute sinusitis, unspecified: Secondary | ICD-10-CM | POA: Diagnosis not present

## 2018-10-12 ENCOUNTER — Encounter: Payer: Self-pay | Admitting: Genetic Counselor

## 2018-10-12 NOTE — Patient Instructions (Signed)
Updated information from the genetic testing laboratory was added to clinic note.  Date added was October 16, 2018.

## 2018-10-28 DIAGNOSIS — Z0001 Encounter for general adult medical examination with abnormal findings: Secondary | ICD-10-CM | POA: Diagnosis not present

## 2018-10-28 DIAGNOSIS — N390 Urinary tract infection, site not specified: Secondary | ICD-10-CM | POA: Diagnosis not present

## 2018-11-03 DIAGNOSIS — G932 Benign intracranial hypertension: Secondary | ICD-10-CM | POA: Diagnosis not present

## 2018-11-03 DIAGNOSIS — K9289 Other specified diseases of the digestive system: Secondary | ICD-10-CM | POA: Diagnosis not present

## 2018-11-03 DIAGNOSIS — D509 Iron deficiency anemia, unspecified: Secondary | ICD-10-CM | POA: Diagnosis not present

## 2018-11-03 DIAGNOSIS — Z0000 Encounter for general adult medical examination without abnormal findings: Secondary | ICD-10-CM | POA: Diagnosis not present

## 2018-12-15 DIAGNOSIS — Z6841 Body Mass Index (BMI) 40.0 and over, adult: Secondary | ICD-10-CM | POA: Diagnosis not present

## 2018-12-15 DIAGNOSIS — Z01419 Encounter for gynecological examination (general) (routine) without abnormal findings: Secondary | ICD-10-CM | POA: Diagnosis not present

## 2019-01-17 DIAGNOSIS — M170 Bilateral primary osteoarthritis of knee: Secondary | ICD-10-CM | POA: Diagnosis not present

## 2019-02-01 DIAGNOSIS — K219 Gastro-esophageal reflux disease without esophagitis: Secondary | ICD-10-CM | POA: Diagnosis not present

## 2019-02-01 DIAGNOSIS — R519 Headache, unspecified: Secondary | ICD-10-CM | POA: Diagnosis not present

## 2019-02-01 DIAGNOSIS — D509 Iron deficiency anemia, unspecified: Secondary | ICD-10-CM | POA: Diagnosis not present

## 2019-02-01 DIAGNOSIS — K625 Hemorrhage of anus and rectum: Secondary | ICD-10-CM | POA: Diagnosis not present

## 2019-02-01 DIAGNOSIS — H8110 Benign paroxysmal vertigo, unspecified ear: Secondary | ICD-10-CM | POA: Diagnosis not present

## 2019-02-01 DIAGNOSIS — K5904 Chronic idiopathic constipation: Secondary | ICD-10-CM | POA: Diagnosis not present

## 2019-02-06 IMAGING — DX DG CHEST 2V
2 series · 2 of 2 positions shown · non-contrast
Comparison: None.

CLINICAL DATA: Palpitations/chest discomfort Pain mid chest since
surgery on [DATE] when GB was removed.- on and off. No SOB

EXAM:
CHEST  2 VIEW

[chest pa]
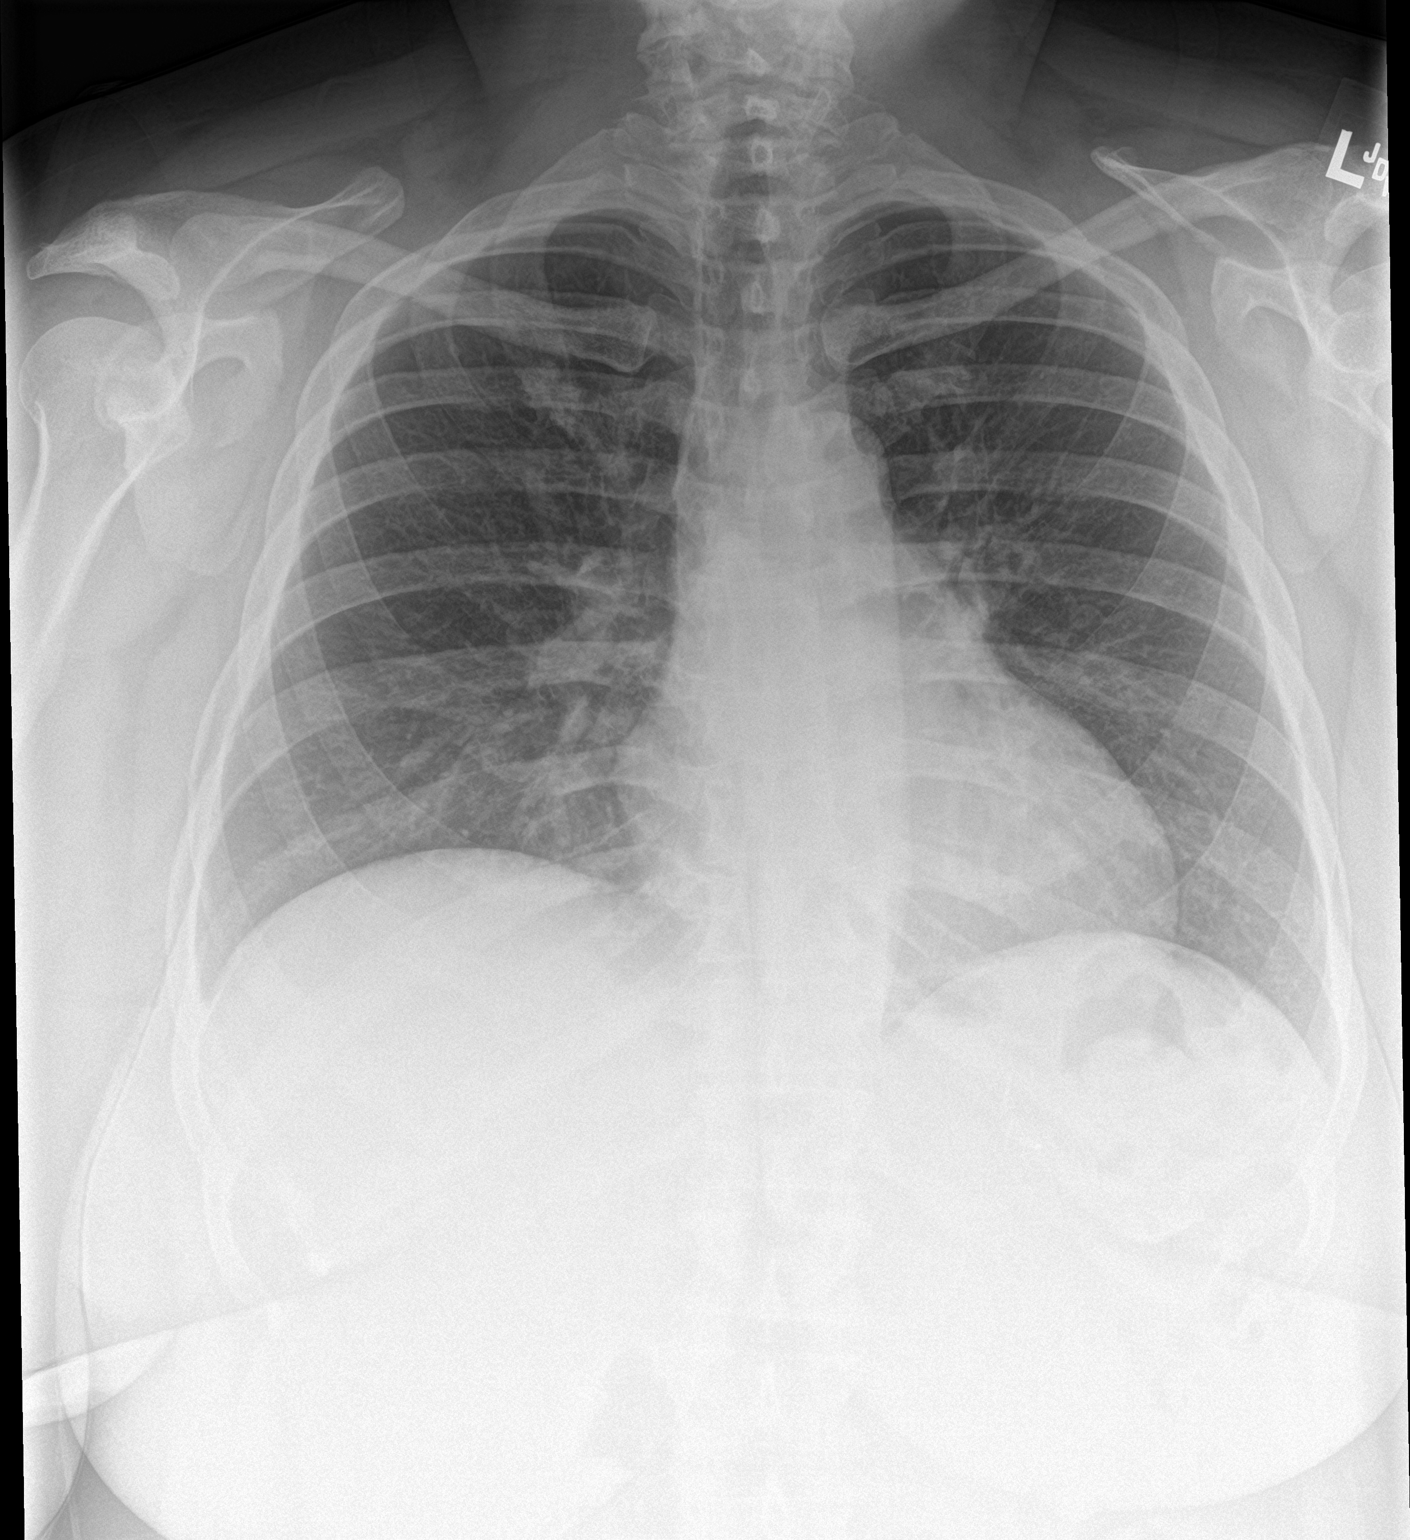

[chest lat]
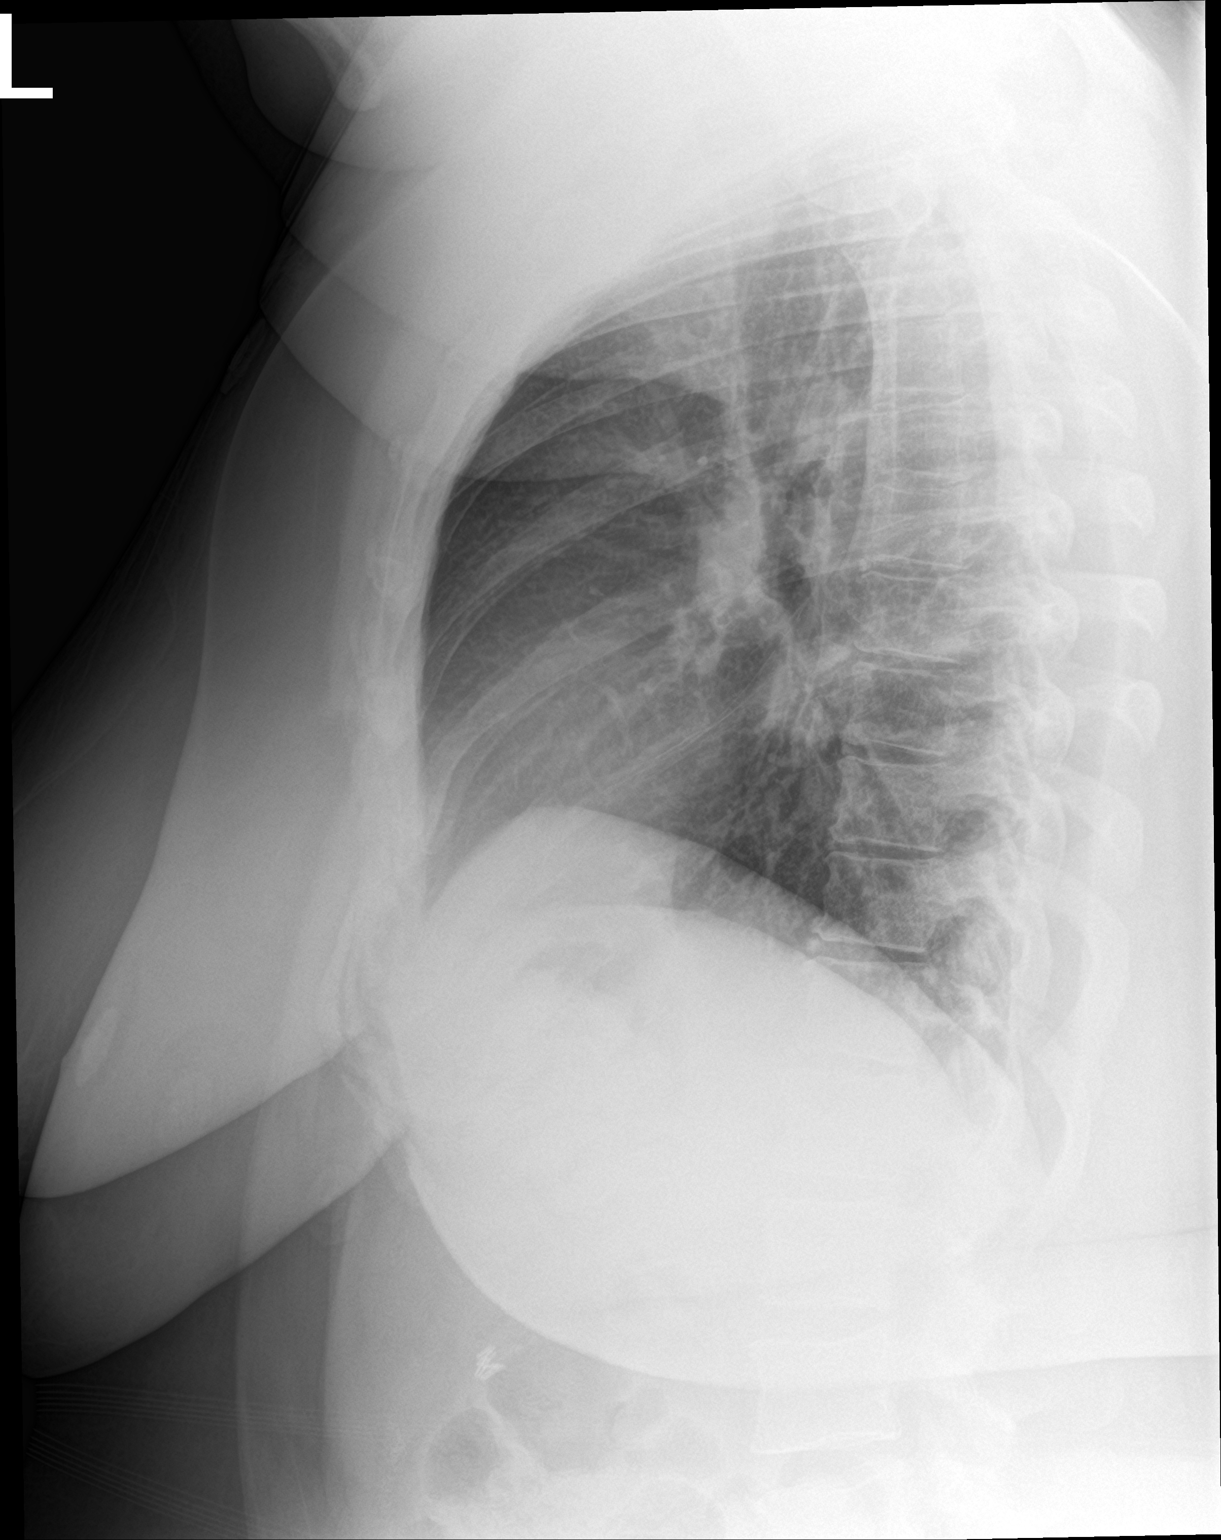

[2 of 2 positions shown; findings below may reference images not displayed]

FINDINGS: Cardiomediastinal silhouette is normal in size and configuration.
Lungs are clear. Lung volumes are normal. No evidence of pneumonia.
No pleural effusion. No pneumothorax seen.

Osseous and soft tissue structures about the chest are unremarkable.
IMPRESSION: No active cardiopulmonary disease.

## 2019-05-05 ENCOUNTER — Other Ambulatory Visit: Payer: Self-pay

## 2019-05-05 ENCOUNTER — Ambulatory Visit: Payer: BC Managed Care – PPO | Attending: Internal Medicine

## 2019-05-05 DIAGNOSIS — Z23 Encounter for immunization: Secondary | ICD-10-CM

## 2019-05-05 NOTE — Progress Notes (Signed)
   Covid-19 Vaccination Clinic  Name:  Katherine Baldwin    MRN: [id number] DOB: [birth year]  05/05/2019  Ms. Katherine Baldwin was observed post Covid-19 immunization for 15 minutes without incidence. She was provided with Vaccine Information Sheet and instruction to access the V-Safe system.   Ms. Katherine Baldwin was instructed to call 911 with any severe reactions post vaccine: Marland Kitchen Difficulty breathing  . Swelling of your face and throat  . A fast heartbeat  . A bad rash all over your body  . Dizziness and weakness    Immunizations Administered    Name Date Dose VIS Date Route   Moderna COVID-19 Vaccine 05/05/2019 11:23 AM 0.5 mL 02/08/2019 Intramuscular   Manufacturer: Moderna   Lot: [id number]   NDC: 31594-585-92

## 2019-05-11 DIAGNOSIS — F4323 Adjustment disorder with mixed anxiety and depressed mood: Secondary | ICD-10-CM | POA: Diagnosis not present

## 2019-05-17 DIAGNOSIS — Z1239 Encounter for other screening for malignant neoplasm of breast: Secondary | ICD-10-CM | POA: Diagnosis not present

## 2019-05-17 DIAGNOSIS — Z1231 Encounter for screening mammogram for malignant neoplasm of breast: Secondary | ICD-10-CM | POA: Diagnosis not present

## 2019-05-31 DIAGNOSIS — N6321 Unspecified lump in the left breast, upper outer quadrant: Secondary | ICD-10-CM | POA: Diagnosis not present

## 2019-05-31 DIAGNOSIS — F4322 Adjustment disorder with anxiety: Secondary | ICD-10-CM | POA: Diagnosis not present

## 2019-06-07 ENCOUNTER — Ambulatory Visit: Payer: BC Managed Care – PPO | Attending: Internal Medicine

## 2019-06-07 DIAGNOSIS — Z23 Encounter for immunization: Secondary | ICD-10-CM

## 2019-06-07 NOTE — Progress Notes (Signed)
   Covid-19 Vaccination Clinic  Name:  Katherine Baldwin    MRN: [id number] DOB: May 11, 1975  06/07/2019  Katherine Baldwin was observed post Covid-19 immunization for 15 minutes without incident. She was provided with Vaccine Information Sheet and instruction to access the V-Safe system.   Katherine Baldwin was instructed to call 911 with any severe reactions post vaccine: Marland Kitchen Difficulty breathing  . Swelling of face and throat  . A fast heartbeat  . A bad rash all over body  . Dizziness and weakness   Immunizations Administered    Name Date Dose VIS Date Route   Moderna COVID-19 Vaccine 06/07/2019 11:27 AM 0.5 mL 02/08/2019 Intramuscular   Manufacturer: Moderna   Lot: [id number]   NDC: 72620-355-97

## 2019-06-16 DIAGNOSIS — F4322 Adjustment disorder with anxiety: Secondary | ICD-10-CM | POA: Diagnosis not present

## 2019-06-29 DIAGNOSIS — G932 Benign intracranial hypertension: Secondary | ICD-10-CM | POA: Diagnosis not present

## 2019-06-29 DIAGNOSIS — H1045 Other chronic allergic conjunctivitis: Secondary | ICD-10-CM | POA: Diagnosis not present

## 2019-07-14 DIAGNOSIS — F4322 Adjustment disorder with anxiety: Secondary | ICD-10-CM | POA: Diagnosis not present

## 2019-07-20 DIAGNOSIS — K649 Unspecified hemorrhoids: Secondary | ICD-10-CM | POA: Diagnosis not present

## 2019-07-20 DIAGNOSIS — M545 Low back pain: Secondary | ICD-10-CM | POA: Diagnosis not present

## 2019-07-20 DIAGNOSIS — D649 Anemia, unspecified: Secondary | ICD-10-CM | POA: Diagnosis not present

## 2019-07-20 DIAGNOSIS — M5416 Radiculopathy, lumbar region: Secondary | ICD-10-CM | POA: Diagnosis not present

## 2019-07-20 DIAGNOSIS — R5383 Other fatigue: Secondary | ICD-10-CM | POA: Diagnosis not present

## 2019-07-20 DIAGNOSIS — G4700 Insomnia, unspecified: Secondary | ICD-10-CM | POA: Diagnosis not present

## 2019-08-02 DIAGNOSIS — F4322 Adjustment disorder with anxiety: Secondary | ICD-10-CM | POA: Diagnosis not present

## 2019-08-02 IMAGING — MG DIGITAL SCREENING BILATERAL MAMMOGRAM WITH TOMO AND CAD
8 series · 8 of 24 positions shown · non-contrast
Comparison: Previous exam(s).

CLINICAL DATA: Screening.

EXAM:
DIGITAL SCREENING BILATERAL MAMMOGRAM WITH TOMO AND CAD

[L CC synth-2D]
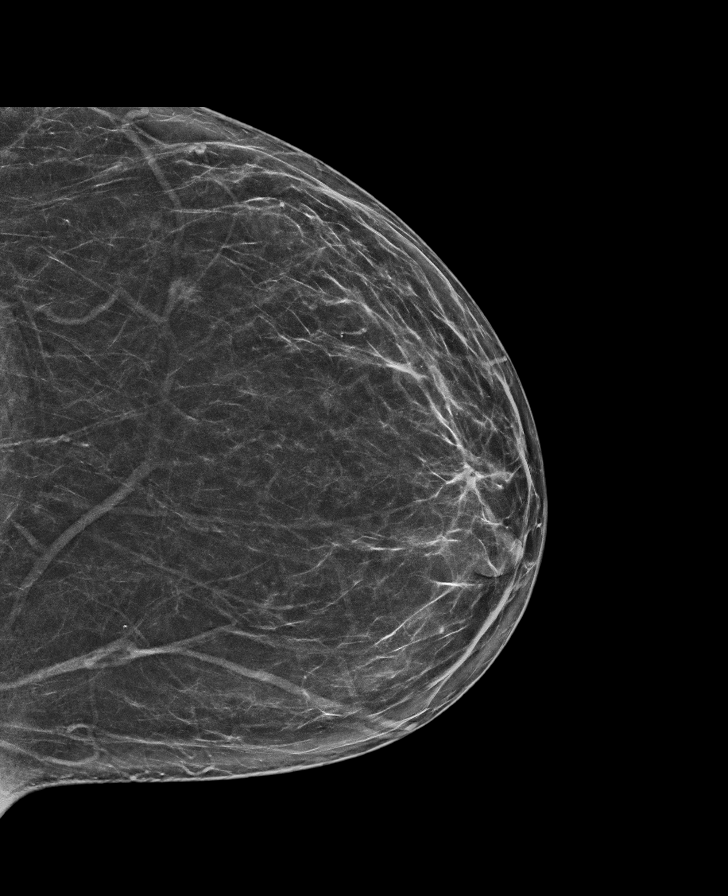

[L MLO synth-2D]
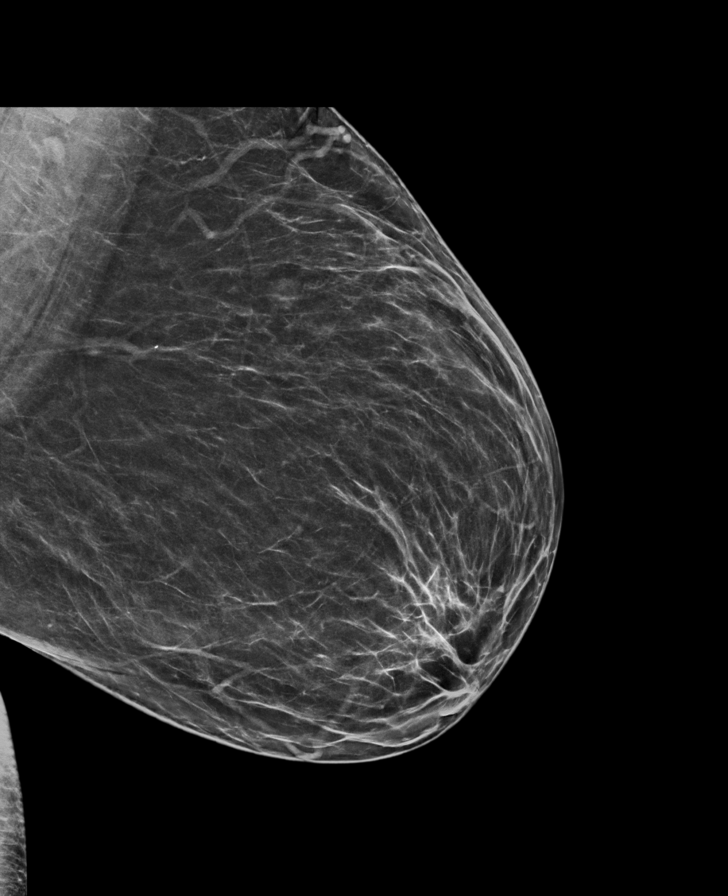

[R CC synth-2D]
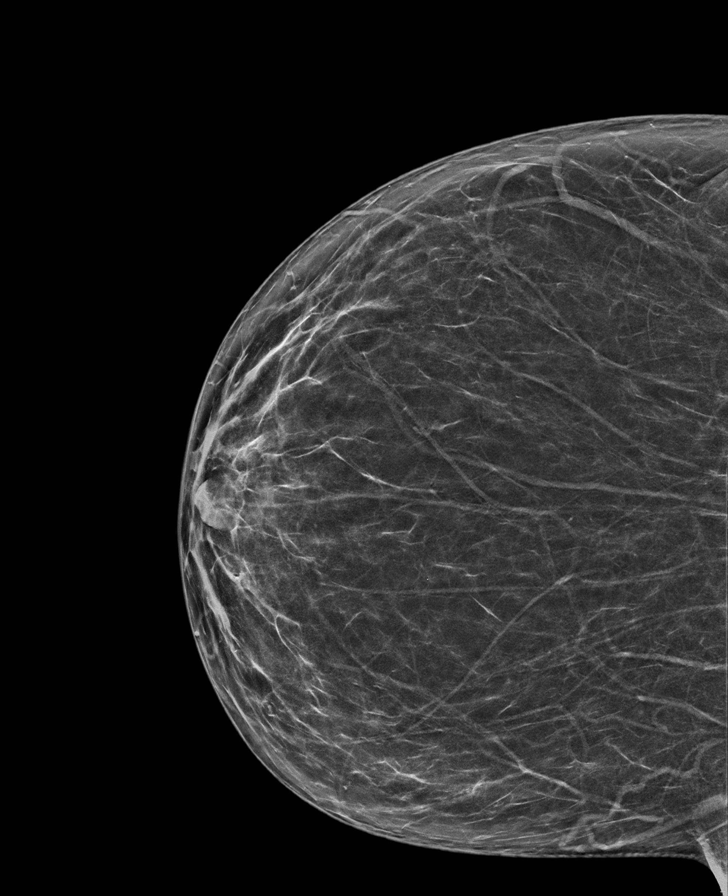

[R MLO synth-2D]
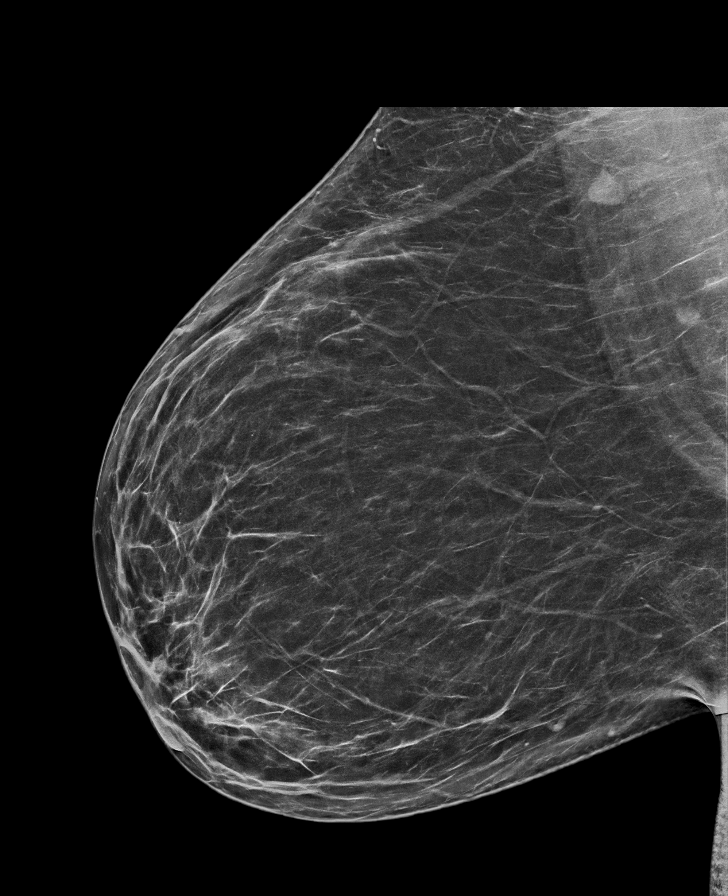

[R CC tomo · tomo slice 37/72.0]
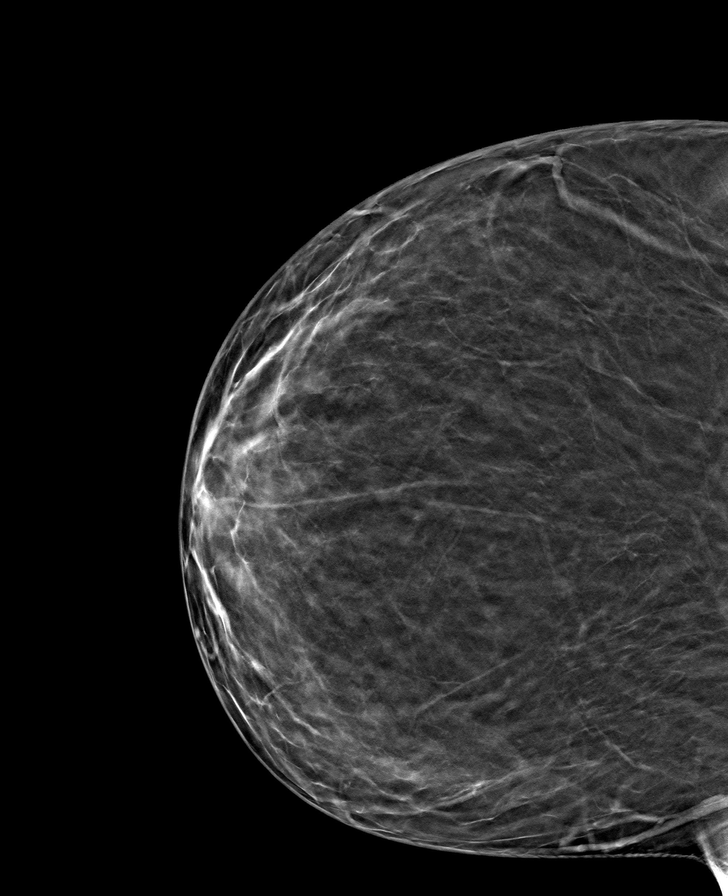

[L CC tomo · tomo slice 37/72.0]
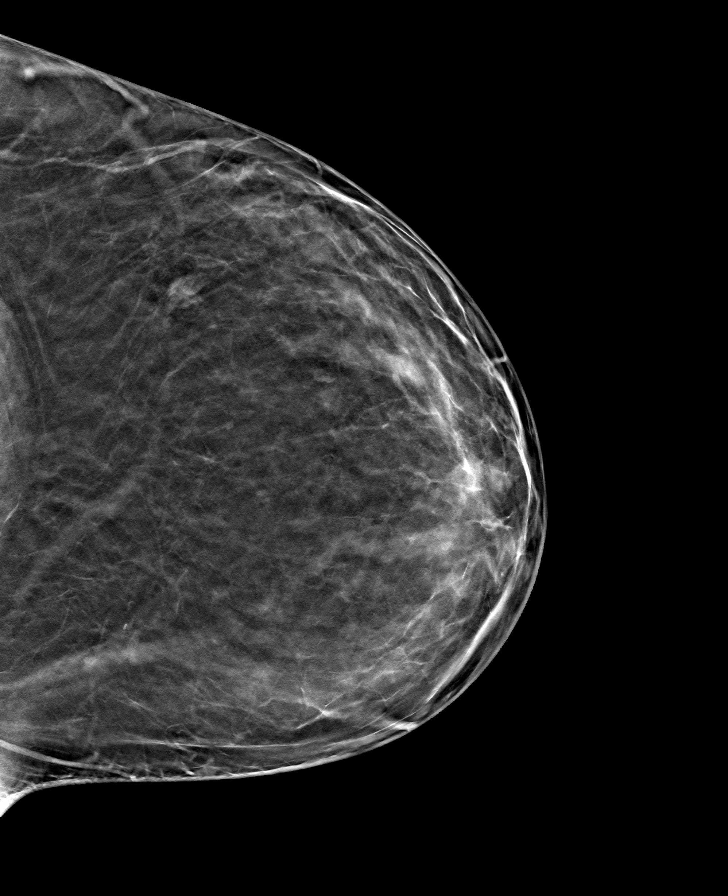

[L MLO tomo · tomo slice 39/78.0]
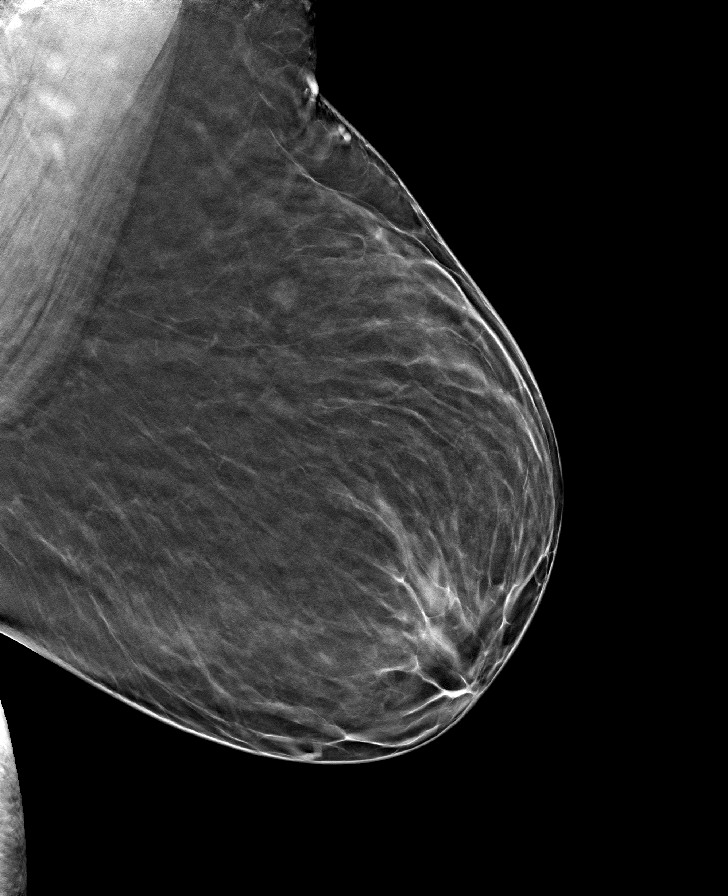

[R MLO tomo · tomo slice 42/83.0]
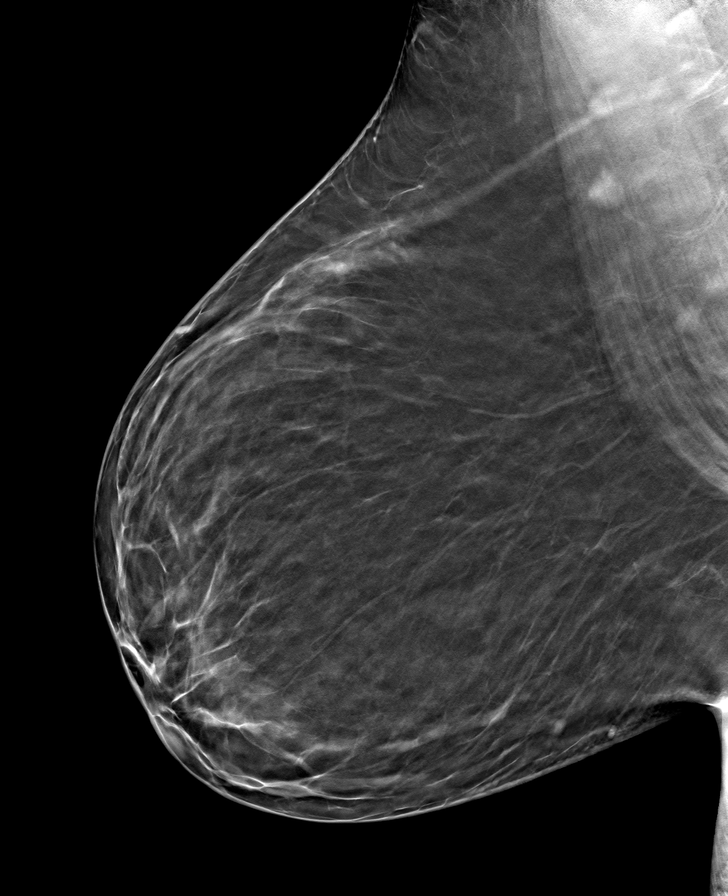

[8 of 24 positions shown; findings below may reference images not displayed]

ACR Breast Density Category b: There are scattered areas of
fibroglandular density.
FINDINGS: In the left breast, a possible asymmetry warrants further
evaluation. In the right breast, no findings suspicious for
malignancy. Images were processed with CAD.
IMPRESSION: Further evaluation is suggested for possible asymmetry in the left
breast.

RECOMMENDATION:
Diagnostic mammogram and possibly ultrasound of the left breast.
(Code:DI-5-LL4)

The patient will be contacted regarding the findings, and additional
imaging will be scheduled.

BI-RADS CATEGORY  0: Incomplete. Need additional imaging evaluation
and/or prior mammograms for comparison.

## 2019-08-13 IMAGING — US ULTRASOUND LEFT BREAST LIMITED
1 series · 7 of 7 positions shown · non-contrast
Comparison: Previous exam(s).

ADDENDUM:
The patient returned on 06/03/2013 for stereo/3D guided biopsy of
UPPER-OUTER LEFT breast asymmetry.

No discernible asymmetry could be identified for targeting despite
numerous attempts at visualization. The biopsy was therefore
canceled.
Given family history of breast cancer in her father and with the
patient's [id number] gene mutation elevating her lifetime risk of breast
cancer, bilateral breast MRI is recommended for evaluation of this
UPPER-OUTER LEFT breast asymmetry and for evaluation of the
remainder of both breasts.
This was discussed with the patient.
RECOMMENDATION: Bilateral breast MRI with and without contrast.
CLINICAL DATA: Possible asymmetry in the upper outer left breast on
a recent screening mammogram.
EXAM:
DIGITAL DIAGNOSTIC LEFT MAMMOGRAM WITH TOMO
ULTRASOUND LEFT BREAST

[Series 1: ultrasound left breast limited · 0.08mm/px · 7 of 7 slices shown]
[im 1/7]
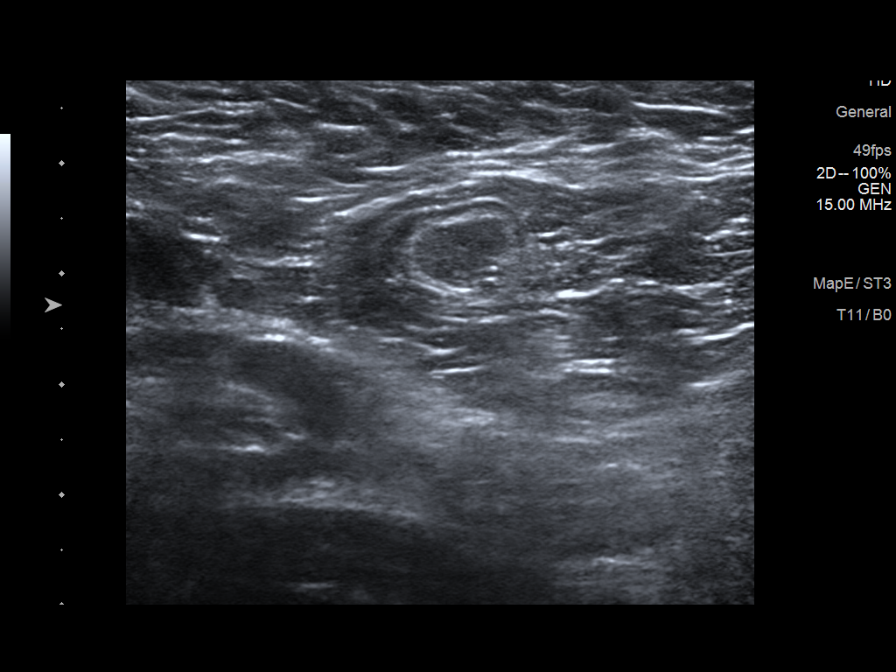
[im 2/7]
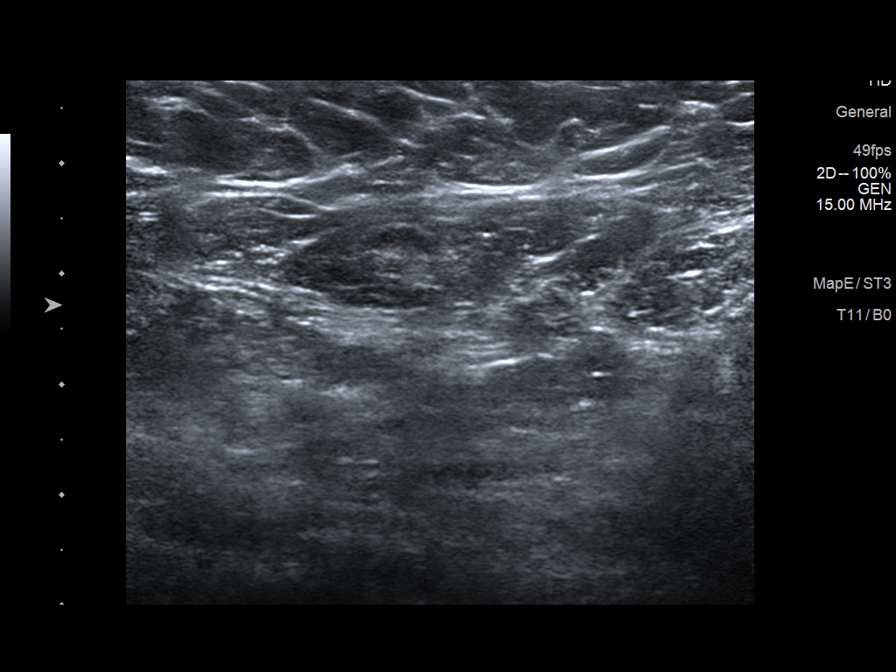
[im 3/7]
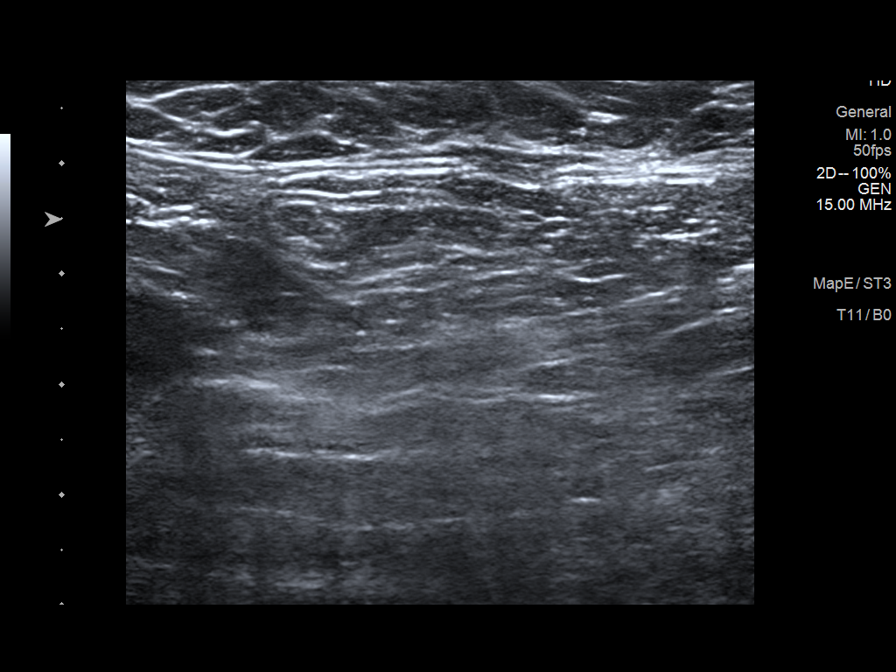
[im 4/7]
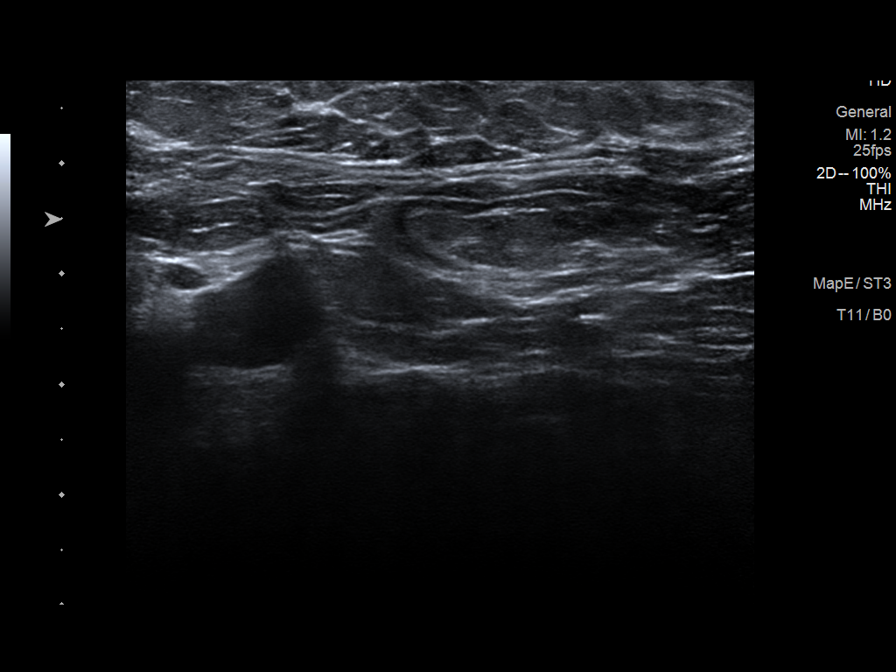
[im 5/7]
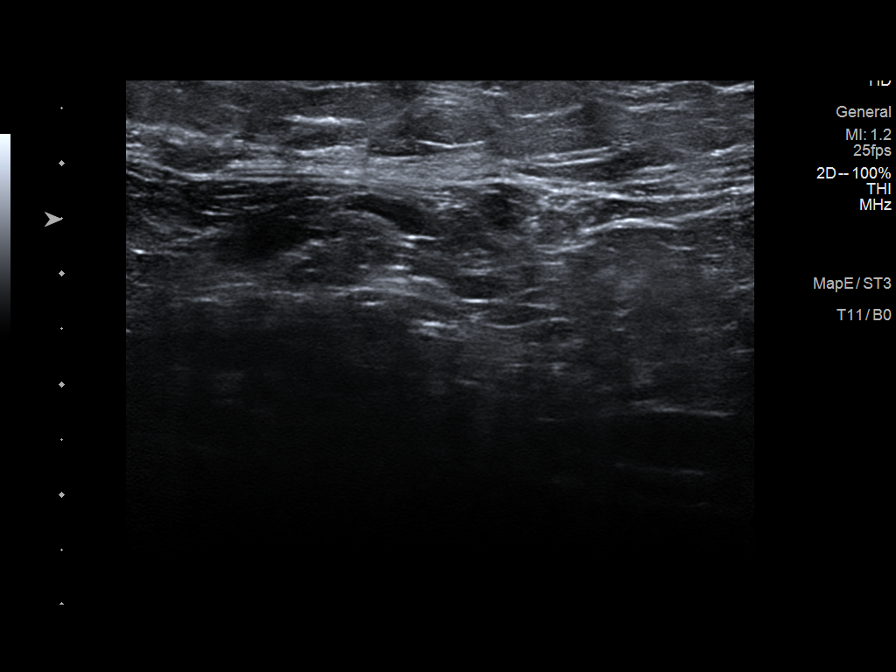
[im 6/7]
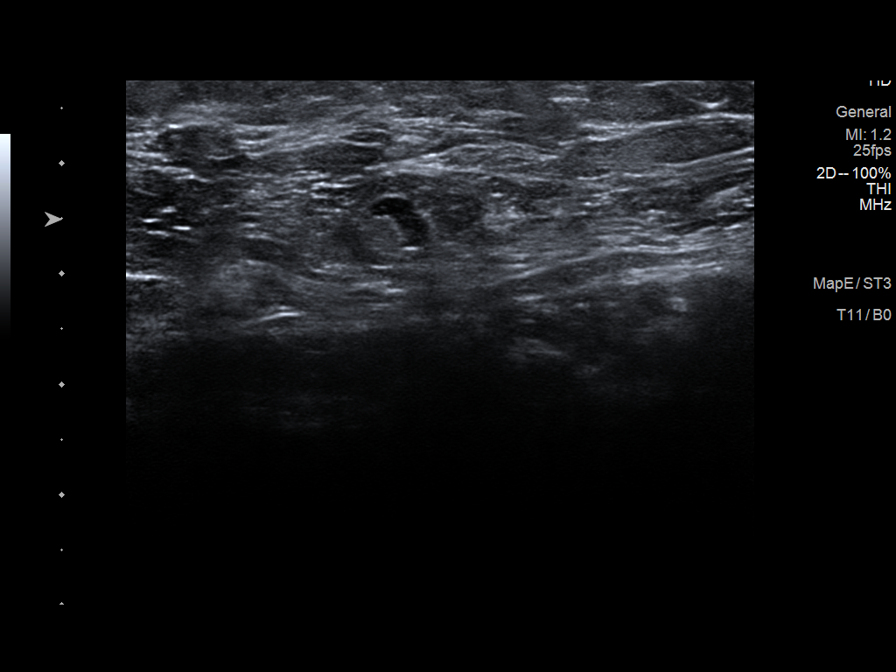
[im 7/7]
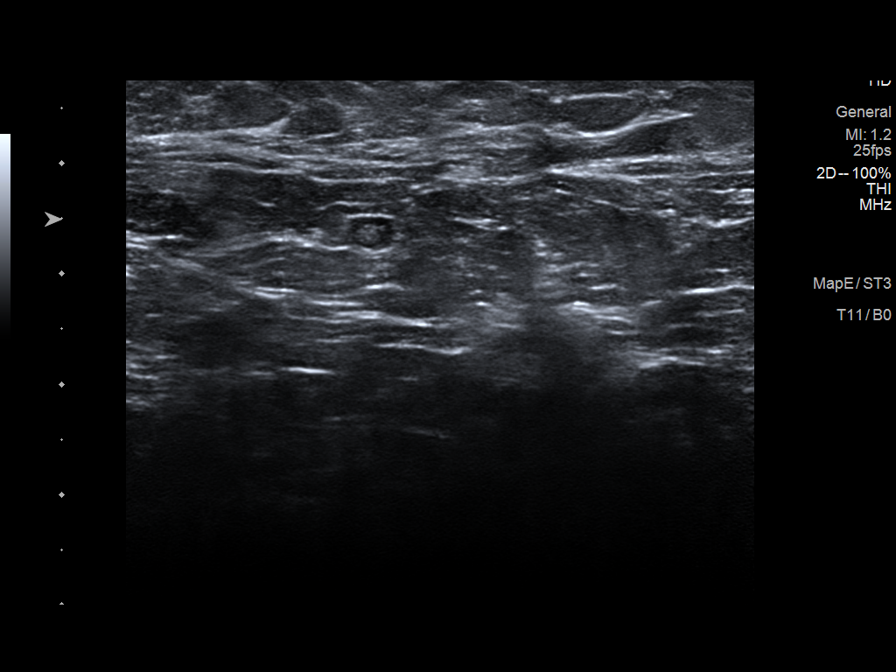

[7 of 7 positions shown; findings below may reference images not displayed]

ACR Breast Density Category b: There are scattered areas of
fibroglandular density.
FINDINGS: 3D tomographic and 2D generated spot compression images of the left
breast were obtained. These confirm an approximately 1.4 x 1.0 x
cm oval area of asymmetrical density with interspersed fat in the
posterior aspect of the upper-outer quadrant of the breast. This has
indistinct margins and represents a developing density.

On physical exam, no mass palpable thickening is present in the
outer left breast. There are no palpable left axillary lymph nodes.

Targeted ultrasound is performed, showing normal appearing breast
tissue throughout the outer left breast. Normal appearing left
axillary lymph nodes.
IMPRESSION: 1.4 cm developing asymmetry in the outer left breast with no
ultrasound correlate. This has nonspecific mammogram features.

RECOMMENDATION:
Stereotactic guided core needle biopsy of the 1.4 cm asymmetry in
the outer left breast. This has been discussed with the patient and
scheduled at [DATE] p.m. on 06/03/2017.

I have discussed the findings and recommendations with the patient.
Results were also provided in writing at the conclusion of the
visit. If applicable, a reminder letter will be sent to the patient
regarding the next appointment.

BI-RADS CATEGORY  4: Suspicious.

## 2019-08-18 DIAGNOSIS — F4322 Adjustment disorder with anxiety: Secondary | ICD-10-CM | POA: Diagnosis not present

## 2019-08-19 DIAGNOSIS — D509 Iron deficiency anemia, unspecified: Secondary | ICD-10-CM | POA: Diagnosis not present

## 2019-08-26 DIAGNOSIS — D509 Iron deficiency anemia, unspecified: Secondary | ICD-10-CM | POA: Diagnosis not present

## 2019-09-01 DIAGNOSIS — F4320 Adjustment disorder, unspecified: Secondary | ICD-10-CM | POA: Diagnosis not present

## 2019-09-14 DIAGNOSIS — F4322 Adjustment disorder with anxiety: Secondary | ICD-10-CM | POA: Diagnosis not present

## 2019-09-14 DIAGNOSIS — D509 Iron deficiency anemia, unspecified: Secondary | ICD-10-CM | POA: Diagnosis not present

## 2019-09-16 DIAGNOSIS — K921 Melena: Secondary | ICD-10-CM | POA: Diagnosis not present

## 2019-09-16 DIAGNOSIS — K219 Gastro-esophageal reflux disease without esophagitis: Secondary | ICD-10-CM | POA: Diagnosis not present

## 2019-09-16 DIAGNOSIS — D509 Iron deficiency anemia, unspecified: Secondary | ICD-10-CM | POA: Diagnosis not present

## 2019-10-05 DIAGNOSIS — F4320 Adjustment disorder, unspecified: Secondary | ICD-10-CM | POA: Diagnosis not present

## 2019-10-20 DIAGNOSIS — F4320 Adjustment disorder, unspecified: Secondary | ICD-10-CM | POA: Diagnosis not present

## 2019-11-04 DIAGNOSIS — R635 Abnormal weight gain: Secondary | ICD-10-CM | POA: Diagnosis not present

## 2019-11-24 DIAGNOSIS — Z6841 Body Mass Index (BMI) 40.0 and over, adult: Secondary | ICD-10-CM | POA: Diagnosis not present

## 2019-11-24 DIAGNOSIS — G4730 Sleep apnea, unspecified: Secondary | ICD-10-CM | POA: Diagnosis not present

## 2019-11-24 DIAGNOSIS — R030 Elevated blood-pressure reading, without diagnosis of hypertension: Secondary | ICD-10-CM | POA: Diagnosis not present

## 2019-11-24 DIAGNOSIS — K219 Gastro-esophageal reflux disease without esophagitis: Secondary | ICD-10-CM | POA: Diagnosis not present

## 2019-11-24 DIAGNOSIS — R635 Abnormal weight gain: Secondary | ICD-10-CM | POA: Diagnosis not present

## 2019-11-29 DIAGNOSIS — F4323 Adjustment disorder with mixed anxiety and depressed mood: Secondary | ICD-10-CM | POA: Diagnosis not present

## 2019-12-12 DIAGNOSIS — M5459 Other low back pain: Secondary | ICD-10-CM | POA: Diagnosis not present

## 2019-12-12 DIAGNOSIS — M170 Bilateral primary osteoarthritis of knee: Secondary | ICD-10-CM | POA: Diagnosis not present

## 2019-12-14 DIAGNOSIS — D509 Iron deficiency anemia, unspecified: Secondary | ICD-10-CM | POA: Diagnosis not present

## 2019-12-14 DIAGNOSIS — Z8719 Personal history of other diseases of the digestive system: Secondary | ICD-10-CM | POA: Diagnosis not present

## 2019-12-14 DIAGNOSIS — K449 Diaphragmatic hernia without obstruction or gangrene: Secondary | ICD-10-CM | POA: Diagnosis not present

## 2019-12-14 DIAGNOSIS — Z9884 Bariatric surgery status: Secondary | ICD-10-CM | POA: Diagnosis not present

## 2019-12-16 DIAGNOSIS — K219 Gastro-esophageal reflux disease without esophagitis: Secondary | ICD-10-CM | POA: Diagnosis not present

## 2019-12-16 DIAGNOSIS — R519 Headache, unspecified: Secondary | ICD-10-CM | POA: Diagnosis not present

## 2019-12-16 DIAGNOSIS — Z6841 Body Mass Index (BMI) 40.0 and over, adult: Secondary | ICD-10-CM | POA: Diagnosis not present

## 2019-12-16 DIAGNOSIS — R635 Abnormal weight gain: Secondary | ICD-10-CM | POA: Diagnosis not present

## 2019-12-19 DIAGNOSIS — Z0001 Encounter for general adult medical examination with abnormal findings: Secondary | ICD-10-CM | POA: Diagnosis not present

## 2019-12-19 DIAGNOSIS — Z713 Dietary counseling and surveillance: Secondary | ICD-10-CM | POA: Diagnosis not present

## 2019-12-19 DIAGNOSIS — Z6841 Body Mass Index (BMI) 40.0 and over, adult: Secondary | ICD-10-CM | POA: Diagnosis not present

## 2019-12-19 DIAGNOSIS — D509 Iron deficiency anemia, unspecified: Secondary | ICD-10-CM | POA: Diagnosis not present

## 2019-12-26 DIAGNOSIS — M1710 Unilateral primary osteoarthritis, unspecified knee: Secondary | ICD-10-CM | POA: Diagnosis not present

## 2019-12-26 DIAGNOSIS — M5136 Other intervertebral disc degeneration, lumbar region: Secondary | ICD-10-CM | POA: Diagnosis not present

## 2019-12-26 DIAGNOSIS — Z23 Encounter for immunization: Secondary | ICD-10-CM | POA: Diagnosis not present

## 2019-12-26 DIAGNOSIS — Z0000 Encounter for general adult medical examination without abnormal findings: Secondary | ICD-10-CM | POA: Diagnosis not present

## 2019-12-26 DIAGNOSIS — D509 Iron deficiency anemia, unspecified: Secondary | ICD-10-CM | POA: Diagnosis not present

## 2019-12-29 DIAGNOSIS — Z6841 Body Mass Index (BMI) 40.0 and over, adult: Secondary | ICD-10-CM | POA: Diagnosis not present

## 2019-12-29 DIAGNOSIS — K219 Gastro-esophageal reflux disease without esophagitis: Secondary | ICD-10-CM | POA: Diagnosis not present

## 2019-12-29 DIAGNOSIS — R635 Abnormal weight gain: Secondary | ICD-10-CM | POA: Diagnosis not present

## 2019-12-30 DIAGNOSIS — M545 Low back pain, unspecified: Secondary | ICD-10-CM | POA: Diagnosis not present

## 2020-01-04 DIAGNOSIS — F4320 Adjustment disorder, unspecified: Secondary | ICD-10-CM | POA: Diagnosis not present

## 2020-01-10 ENCOUNTER — Ambulatory Visit: Payer: Self-pay | Admitting: Genetic Counselor

## 2020-01-10 DIAGNOSIS — Z1379 Encounter for other screening for genetic and chromosomal anomalies: Secondary | ICD-10-CM

## 2020-01-10 NOTE — Progress Notes (Signed)
HPI:  Ms. Haubner was previously seen in the [facility] due to a family history of breast cancer in her father and concerns regarding a hereditary predisposition to cancer. Please refer to our prior cancer genetics clinic note from January 24, 2020 more information regarding our discussion, assessment and recommendations, at the time. Ms. Kirschbaum original genetic test results have been amended based on new Ambry RNA data.  These amended results were disclosed to her, as were recommendations warranted by these results. These results and recommendations are discussed in more detail below.  CANCER HISTORY:  Oncology History   No history exists.    FAMILY HISTORY:  We obtained a detailed, 4-generation family history.  Significant diagnoses are listed below: Family History  Problem Relation Age of Onset  . Diabetes Paternal Grandfather   . Diabetes Paternal Grandmother   . Diabetes Maternal Grandmother   . Diabetes Maternal Grandfather   . Breast cancer Father 7  . Arthritis Father        rhematoid  . Diabetes Father        Prediabetes  . Prostate cancer Father 70  . Stomach cancer Father 55  . Cancer - Other Father 7       gallbladder  . Diabetes Mother   . Asthma Mother   . Hypertension Mother   . Fibroids Sister   . Thyroid disease Sister     The family history from 27 is listed below:  The patient does not have children.  She has two sisters and one brother who are cancer free.  Her father is deceased and her mother is living.  The patient's father was diagnosed with breast cancer at age 34, prostate cancer at 21 and gallbladder cancer at 66 and he died around age 62.  He was an only child.  His parents died of non-cancer related issues.  The patient's mother is living.  She had four sisters and two brothers who are cancer free.  Her parents died of non-cancer related issues.  Patient's maternal ancestors are of African American descent, and  paternal ancestors are of African American descent. There is no reported Ashkenazi Jewish ancestry. There is no known consanguinity.    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  On January 06, 2020 an amended report was sent out indicating that this RAD50 variant has been reclassified from 'likely pathogenic' to unknown significance based on new Ambry RNA data.  This report supercedes all previous reports. The test report has been scanned into EPIC and is located under the Molecular Pathology section of the Results Review tab.  A portion of the result report is included below for reference.     We discussed with Ms. Kozlov that because current genetic testing is not perfect, it is possible there may be a gene mutation in one of these genes that current testing cannot detect, but that chance is small.  We also discussed, that there could be another gene that has not yet been discovered, or that we have not yet tested, that is responsible for the cancer diagnoses in the family. It is also possible there is a hereditary cause for the cancer in the family that Ms. Beharry did not inherit and therefore was not identified in her testing.  Therefore, it is important to remain in touch with cancer genetics in the future so that we can continue  to offer Ms. Halberg the most up to date genetic testing.   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  At this time, it is unknown if this variant is associated with increased cancer risk or if this is a normal finding, but most variants such as this get reclassified to being inconsequential. It should not be used to make medical management decisions. With time, we suspect the lab will determine the significance of this variant, if any.  If we do learn more about it, we will try to contact Ms. Yin to discuss it further. However, it is important to stay in touch with [place] periodically and keep the address and phone number up to date.  ADDITIONAL GENETIC TESTING: We discussed with Ms. Angeletti that there are other genes that are associated with increased cancer risk that can be analyzed. Should Ms. Justman wish to pursue additional genetic testing, we are happy to discuss and coordinate this testing, at any time.    CANCER SCREENING RECOMMENDATIONS: Ms. Catalfamo test result is considered negative (normal).  This means that we have not identified a hereditary cause for her family history of female breast cancer at this time. Most cancers happen by chance and this negative test suggests that her cancer may fall into this category.    While reassuring, this does not definitively rule out a hereditary predisposition to cancer. It is still possible that there could be genetic mutations that are undetectable by current technology. There could be genetic mutations in genes that have not been tested or identified to increase cancer risk.  Therefore, it is recommended she continue to follow the cancer management and screening guidelines provided by her primary healthcare provider.   An individual's cancer risk and medical management are not determined by genetic test results alone. Overall cancer risk assessment incorporates additional factors, including personal medical history, family history, and any available genetic information that may result in a personalized plan for cancer prevention and surveillance  Based on Ms. Pankonin family of cancer, as well as her genetic test results, statistical models (Tyrer Cusik)  and literature data were used to estimate her risk of developing breast cancer. This estimates her lifetime risk of developing breast cancer to be approximately 23.8%.  The patient's lifetime breast cancer risk is a preliminary  estimate based on available information using one of several models endorsed by the American Cancer Society (ACS). The ACS recommends consideration of breast MRI screening as an adjunct to mammography for patients at high risk (defined as 20% or greater lifetime risk). A more detailed breast cancer risk assessment can be considered, if clinically indicated.   Ms. Manon has been determined to be at high risk for breast cancer.  Therefore, we recommend that annual screening with mammography and breast MRI begin at age 46, or 10 years prior to the age of breast cancer diagnosis in a relative (whichever is earlier).  We discussed that Ms. Bogdanski should discuss her individual situation with her referring physician and determine a breast cancer screening plan with which they are both comfortable.      RECOMMENDATIONS FOR FAMILY MEMBERS:  Individuals in this family might be at some increased risk of developing cancer, over the general population risk, simply due to the family history of cancer.  We recommended women in this family have a yearly mammogram beginning at age 48, or 23 years younger than the earliest onset of cancer, an annual  clinical breast exam, and perform monthly breast self-exams. Women in this family should also have a gynecological exam as recommended by their primary provider. All family members should be referred for colonoscopy starting at age 20.  It is also possible there is a hereditary cause for the cancer in Ms. Kopke family that she did not inherit and therefore was not identified in her.  Based on Ms. Waldorf family history, we recommended her siblings have genetic counseling and testing. Ms. Wahler will let us know if we can be of any assistance in coordinating genetic counseling and/or testing for this family member.   FOLLOW-UP: Lastly, we discussed with Ms. Elling that cancer genetics is a rapidly advancing field and it is possible that new genetic tests will be  appropriate for her and/or her family members in the future. We encouraged her to remain in contact with cancer genetics on an annual basis so we can update her personal and family histories and let her know of advances in cancer genetics that may benefit this family.   Our contact number was provided. Ms. Pepperman questions were answered to her satisfaction, and she knows she is welcome to call us at anytime with additional questions or concerns.   Maylon Cos, MS, [facility] Licensed, Certified Genetic Counselor [email or web address]

## 2020-01-12 DIAGNOSIS — M545 Low back pain, unspecified: Secondary | ICD-10-CM | POA: Diagnosis not present

## 2020-01-13 DIAGNOSIS — Z01419 Encounter for gynecological examination (general) (routine) without abnormal findings: Secondary | ICD-10-CM | POA: Diagnosis not present

## 2020-01-13 DIAGNOSIS — Z6841 Body Mass Index (BMI) 40.0 and over, adult: Secondary | ICD-10-CM | POA: Diagnosis not present

## 2020-01-19 DIAGNOSIS — M545 Low back pain, unspecified: Secondary | ICD-10-CM | POA: Diagnosis not present

## 2020-01-19 DIAGNOSIS — Z6841 Body Mass Index (BMI) 40.0 and over, adult: Secondary | ICD-10-CM | POA: Diagnosis not present

## 2020-01-19 DIAGNOSIS — K219 Gastro-esophageal reflux disease without esophagitis: Secondary | ICD-10-CM | POA: Diagnosis not present

## 2020-02-07 DIAGNOSIS — R635 Abnormal weight gain: Secondary | ICD-10-CM | POA: Diagnosis not present

## 2020-02-07 DIAGNOSIS — F4323 Adjustment disorder with mixed anxiety and depressed mood: Secondary | ICD-10-CM | POA: Diagnosis not present

## 2020-02-07 DIAGNOSIS — M159 Polyosteoarthritis, unspecified: Secondary | ICD-10-CM | POA: Diagnosis not present

## 2020-02-07 DIAGNOSIS — Z6841 Body Mass Index (BMI) 40.0 and over, adult: Secondary | ICD-10-CM | POA: Diagnosis not present

## 2020-02-07 DIAGNOSIS — F32A Depression, unspecified: Secondary | ICD-10-CM | POA: Diagnosis not present

## 2020-02-15 DIAGNOSIS — Z6841 Body Mass Index (BMI) 40.0 and over, adult: Secondary | ICD-10-CM | POA: Diagnosis not present

## 2020-02-15 DIAGNOSIS — K219 Gastro-esophageal reflux disease without esophagitis: Secondary | ICD-10-CM | POA: Diagnosis not present

## 2020-02-15 DIAGNOSIS — K449 Diaphragmatic hernia without obstruction or gangrene: Secondary | ICD-10-CM | POA: Diagnosis not present

## 2020-02-27 DIAGNOSIS — E569 Vitamin deficiency, unspecified: Secondary | ICD-10-CM | POA: Diagnosis not present

## 2020-02-27 DIAGNOSIS — K219 Gastro-esophageal reflux disease without esophagitis: Secondary | ICD-10-CM | POA: Diagnosis not present

## 2020-02-27 DIAGNOSIS — Z13220 Encounter for screening for lipoid disorders: Secondary | ICD-10-CM | POA: Diagnosis not present

## 2020-02-27 DIAGNOSIS — E611 Iron deficiency: Secondary | ICD-10-CM | POA: Diagnosis not present

## 2020-03-01 DIAGNOSIS — Z6841 Body Mass Index (BMI) 40.0 and over, adult: Secondary | ICD-10-CM | POA: Diagnosis not present

## 2020-03-01 DIAGNOSIS — Z13220 Encounter for screening for lipoid disorders: Secondary | ICD-10-CM | POA: Diagnosis not present

## 2020-03-01 DIAGNOSIS — E569 Vitamin deficiency, unspecified: Secondary | ICD-10-CM | POA: Diagnosis not present

## 2020-03-01 DIAGNOSIS — E611 Iron deficiency: Secondary | ICD-10-CM | POA: Diagnosis not present

## 2023-01-21 ENCOUNTER — Encounter (HOSPITAL_COMMUNITY): Payer: Self-pay

## 2023-01-21 ENCOUNTER — Other Ambulatory Visit: Payer: Self-pay

## 2023-01-21 ENCOUNTER — Emergency Department (HOSPITAL_COMMUNITY)
Admission: EM | Admit: 2023-01-21 | Discharge: 2023-01-22 | Disposition: A | Discharge status: Home / Self Care | Payer: Self-pay | Attending: Emergency Medicine | Admitting: Emergency Medicine

## 2023-01-21 ENCOUNTER — Emergency Department (HOSPITAL_COMMUNITY): Payer: Self-pay

## 2023-01-21 DIAGNOSIS — R079 Chest pain, unspecified: Secondary | ICD-10-CM | POA: Insufficient documentation

## 2023-01-21 LAB — CBC
HCT: 35.4 % — ABNORMAL LOW (ref 36.0–46.0)
Hemoglobin: 10.8 g/dL — ABNORMAL LOW (ref 12.0–15.0)
MCH: 27.3 pg (ref 26.0–34.0)
MCHC: 30.5 g/dL (ref 30.0–36.0)
MCV: 89.4 fL (ref 80.0–100.0)
Platelets: 294 10*3/uL (ref 150–400)
RBC: 3.96 MIL/uL (ref 3.87–5.11)
RDW: 13.2 % (ref 11.5–15.5)
WBC: 8.8 10*3/uL (ref 4.0–10.5)
nRBC: 0 % (ref 0.0–0.2)

## 2023-01-21 LAB — COMPREHENSIVE METABOLIC PANEL
ALT: 15 U/L (ref 0–44)
AST: 18 U/L (ref 15–41)
Albumin: 3.1 g/dL — ABNORMAL LOW (ref 3.5–5.0)
Alkaline Phosphatase: 54 U/L (ref 38–126)
Anion gap: 10 (ref 5–15)
BUN: 20 mg/dL (ref 6–20)
CO2: 22 mmol/L (ref 22–32)
Calcium: 8.8 mg/dL — ABNORMAL LOW (ref 8.9–10.3)
Chloride: 105 mmol/L (ref 98–111)
Creatinine, Ser: 0.77 mg/dL (ref 0.44–1.00)
GFR, Estimated: >60 mL/min (ref >60)
Glucose, Bld: 95 mg/dL (ref 70–99)
Potassium: 3.8 mmol/L (ref 3.5–5.1)
Sodium: 137 mmol/L (ref 135–145)
Total Bilirubin: 0.3 mg/dL (ref <1.2)
Total Protein: 6.4 g/dL — ABNORMAL LOW (ref 6.5–8.1)

## 2023-01-21 LAB — LIPASE, BLOOD: Lipase: 29 U/L (ref 11–51)

## 2023-01-21 MED ORDER — ALUM & MAG HYDROXIDE-SIMETH 200-200-20 MG/5ML PO SUSP
30.0000 mL | Freq: Once | ORAL | Status: AC
  Administered 2023-01-21: 30 mL via ORAL
  Filled 2023-01-21: qty 30

## 2023-01-21 MED ORDER — ONDANSETRON 4 MG PO TBDP
8.0000 mg | ORAL_TABLET | Freq: Once | ORAL | Status: AC
  Administered 2023-01-21: 8 mg via ORAL
  Filled 2023-01-21: qty 2

## 2023-01-21 MED ORDER — FAMOTIDINE 20 MG PO TABS
20.0000 mg | ORAL_TABLET | Freq: Once | ORAL | Status: AC
  Administered 2023-01-21: 20 mg via ORAL
  Filled 2023-01-21: qty 1

## 2023-01-21 NOTE — ED Provider Notes (Incomplete)
[place] EMERGENCY DEPARTMENT AT [facility] Provider Note   CSN: [id number] Arrival date & time: 01/21/23  2201     History {Add pertinent medical, surgical, social history, OB history to HPI:1} Chief Complaint  Patient presents with  . Chest Pain    Katherine Baldwin is a 47 y.o. female.   Chest Pain   47 year old female presents emergency department with complaints of upper middle abdomen pain.  Patient reports pain beginning when he was sitting in bed around 9 PM when she was sitting still.  Denies radiation of pain.  States that at that time, felt increasingly anxious causing her to feel short of breath.  States that she took several aspirin right after feeling symptoms and noted some improvement.  Reports history of GERD but states this feels different.  Denies any cough, fever, chills, nausea, vomiting, urinary symptoms, change in bowel habits.  Denies any family history of cardiac issues  Past medical history significant for GERD, bronchitis, obesity  Home Medications Prior to Admission medications   Medication Sig Start Date End Date Taking? Authorizing Provider  cetirizine (ZYRTEC) 10 MG tablet Take 10 mg by mouth daily as needed for allergies.    [provider]  Multiple Vitamins-Minerals (MULTIVITAMIN WITH MINERALS) tablet Take 1 tablet by mouth daily.    [provider]  omeprazole (PRILOSEC) 40 MG capsule Take 40 mg by mouth every morning.    [provider]  oxyCODONE (OXY IR/ROXICODONE) 5 MG immediate release tablet Take 1-2 tablets (5-10 mg total) by mouth every 6 (six) hours as needed for moderate pain, severe pain or breakthrough pain. 11/19/16   Almond Lint, MD  PENNSAID 2 % SOLN Apply 1 application topically 2 (two) times daily as needed. 11/10/16   [provider]      Allergies    Fruit & vegetable daily [nutritional supplements] and Peanuts [peanut oil]    Review of Systems   Review of Systems   Cardiovascular:  Positive for chest pain.  All other systems reviewed and are negative.   Physical Exam Updated Vital Signs BP 110/66 (BP Location: Left Arm)   Pulse 68   Temp 98.6 F (37 C) (Oral)   Resp 19   SpO2 100%  Physical Exam Vitals and nursing note reviewed.  Constitutional:      General: She is not in acute distress.    Appearance: She is well-developed.  HENT:     Head: Normocephalic and atraumatic.  Eyes:     Conjunctiva/sclera: Conjunctivae normal.  Cardiovascular:     Rate and Rhythm: Normal rate and regular rhythm.     Pulses: Normal pulses.     Heart sounds: No murmur heard. Pulmonary:     Effort: Pulmonary effort is normal. No respiratory distress.     Breath sounds: Normal breath sounds. No wheezing, rhonchi or rales.  Chest:     Chest wall: No tenderness.  Abdominal:     Palpations: Abdomen is soft.     Tenderness: There is abdominal tenderness.     Comments: Mild epigastric tenderness to palpation  Musculoskeletal:        General: No swelling.     Cervical back: Neck supple.     Right lower leg: No edema.     Left lower leg: No edema.  Skin:    General: Skin is warm and dry.     Capillary Refill: Capillary refill takes less than 2 seconds.  Neurological:     Mental Status:  She is alert.  Psychiatric:        Mood and Affect: Mood normal.     ED Results / Procedures / Treatments   Labs (all labs ordered are listed, but only abnormal results are displayed) Labs Reviewed  CBC  COMPREHENSIVE METABOLIC PANEL  LIPASE, BLOOD  POC URINE PREG, ED  TROPONIN I (HIGH SENSITIVITY)    EKG None  Radiology No results found.  Procedures Procedures  {Document cardiac monitor, telemetry assessment procedure when appropriate:1}  Medications Ordered in ED Medications - No data to display  ED Course/ Medical Decision Making/ A&P   {   Click here for ABCD2, HEART and other calculatorsREFRESH Note before signing :1}                               Medical Decision Making Amount and/or Complexity of Data Reviewed Labs: ordered. Radiology: ordered.   This patient presents to the ED for concern of chest pain, this involves an extensive number of treatment options, and is a complaint that carries with it a high risk of complications and morbidity.  The differential diagnosis includes ACS, PE, pneumothorax, GERD, pericarditis/myocarditis/tamponade, aortic dissection, pneumonia, skeletal, other   Co morbidities that complicate the patient evaluation  See HPI   Additional history obtained:  Additional history obtained from EMR External records from outside source obtained and reviewed including hospital records   Lab Tests:  I Ordered, and personally interpreted labs.  The pertinent results include:  ***   Imaging Studies ordered:  I ordered imaging studies including chest x-ray I independently visualized and interpreted imaging which showed *** I agree with the radiologist interpretation   Cardiac Monitoring: / EKG:  The patient was maintained on a cardiac monitor.  I personally viewed and interpreted the cardiac monitored which showed an underlying rhythm of: ***   Consultations Obtained:  I requested consultation with the ***,  and discussed lab and imaging findings as well as pertinent plan - they recommend: ***   Problem List / ED Course / Critical interventions / Medication management  *** I ordered medication including ***  for ***  Reevaluation of the patient after these medicines showed that the patient {resolved/improved/worsened:23923::"improved"} I have reviewed the patients home medicines and have made adjustments as needed   Social Determinants of Health:  ***   Test / Admission - Considered:  Vitals signs significant for ***. Otherwise within normal range and stable throughout visit. Laboratory/imaging studies significant for: *** *** Worrisome signs and symptoms were discussed with the  patient, and the patient acknowledged understanding to return to the ED if noticed. Patient was stable upon discharge.    {Document critical care time when appropriate:1} {Document review of labs and clinical decision tools ie heart score, Chads2Vasc2 etc:1}  {Document your independent review of radiology images, and any outside records:1} {Document your discussion with family members, caretakers, and with consultants:1} {Document social determinants of health affecting pt's care:1} {Document your decision making why or why not admission, treatments were needed:1} Final Clinical Impression(s) / ED Diagnoses Final diagnoses:  None    Rx / DC Orders ED Discharge Orders     None

## 2023-01-21 NOTE — ED Triage Notes (Signed)
Patient BIB [facility] from home for sudden onset of epigastric pressure starting 2100 while sitting in bed. Patient has hx of GERD and reports this did not feel the same. Patient took 324mg  aspirin PTA, A&Ox4 with EMS, BP 128/98 HR 70, RR20, CBG 130, 100% RA. Pain was at most 7/10, now decreased to 4/10. 22 L hand.

## 2023-01-21 NOTE — ED Provider Notes (Signed)
[place] EMERGENCY DEPARTMENT AT [facility] Provider Note   CSN: [id number] Arrival date & time: 01/21/23  2201     History  Chief Complaint  Patient presents with   Chest Pain    Katherine Baldwin is a 47 y.o. female.   Chest Pain   47 year old female presents emergency department with complaints of upper middle abdomen pain.  Patient reports pain beginning when he was sitting in bed around 9 PM when she was sitting still.  Denies radiation of pain.  States that at that time, felt increasingly anxious causing her to feel short of breath.  States that she took several aspirin right after feeling symptoms and noted some improvement.  Reports history of GERD but states this feels different.  Denies any cough, fever, chills, nausea, vomiting, urinary symptoms, change in bowel habits.  Denies any family history of cardiac issues  Past medical history significant for GERD, bronchitis, obesity  Home Medications Prior to Admission medications   Medication Sig Start Date End Date Taking? Authorizing Provider  cetirizine (ZYRTEC) 10 MG tablet Take 10 mg by mouth daily as needed for allergies.    [provider]  Multiple Vitamins-Minerals (MULTIVITAMIN WITH MINERALS) tablet Take 1 tablet by mouth daily.    [provider]  omeprazole (PRILOSEC) 40 MG capsule Take 40 mg by mouth every morning.    [provider]  oxyCODONE (OXY IR/ROXICODONE) 5 MG immediate release tablet Take 1-2 tablets (5-10 mg total) by mouth every 6 (six) hours as needed for moderate pain, severe pain or breakthrough pain. 11/19/16   Almond Lint, MD  PENNSAID 2 % SOLN Apply 1 application topically 2 (two) times daily as needed. 11/10/16   [provider]      Allergies    Fruit & vegetable daily [nutritional supplements] and Peanuts [peanut oil]    Review of Systems   Review of Systems  Cardiovascular:  Positive for chest pain.  All other systems reviewed and are  negative.   Physical Exam Updated Vital Signs BP 108/62   Pulse 66   Temp 98.6 F (37 C) (Oral)   Resp 19   SpO2 96%  Physical Exam Vitals and nursing note reviewed.  Constitutional:      General: She is not in acute distress.    Appearance: She is well-developed.  HENT:     Head: Normocephalic and atraumatic.  Eyes:     Conjunctiva/sclera: Conjunctivae normal.  Cardiovascular:     Rate and Rhythm: Normal rate and regular rhythm.     Pulses: Normal pulses.     Heart sounds: No murmur heard. Pulmonary:     Effort: Pulmonary effort is normal. No respiratory distress.     Breath sounds: Normal breath sounds. No wheezing, rhonchi or rales.  Chest:     Chest wall: No tenderness.  Abdominal:     Palpations: Abdomen is soft.     Tenderness: There is abdominal tenderness.     Comments: Mild epigastric tenderness to palpation  Musculoskeletal:        General: No swelling.     Cervical back: Neck supple.     Right lower leg: No edema.     Left lower leg: No edema.  Skin:    General: Skin is warm and dry.     Capillary Refill: Capillary refill takes less than 2 seconds.  Neurological:     Mental Status: She is alert.  Psychiatric:        Mood  and Affect: Mood normal.     ED Results / Procedures / Treatments   Labs (all labs ordered are listed, but only abnormal results are displayed) Labs Reviewed  CBC - Abnormal; Notable for the following components:      Result Value   Hemoglobin 10.8 (*)    HCT 35.4 (*)    All other components within normal limits  COMPREHENSIVE METABOLIC PANEL - Abnormal; Notable for the following components:   Calcium 8.8 (*)    Total Protein 6.4 (*)    Albumin 3.1 (*)    All other components within normal limits  LIPASE, BLOOD  TROPONIN I (HIGH SENSITIVITY)  TROPONIN I (HIGH SENSITIVITY)    EKG None  Radiology DG Chest 2 View  Result Date: 01/21/2023 CLINICAL DATA:  Chest pain. EXAM: CHEST - 2 VIEW COMPARISON:  November 26, 2016  FINDINGS: The heart size and mediastinal contours are within normal limits. Low lung volumes are noted with mild atelectasis and/or infiltrate seen within the right lung base. No pleural effusion or pneumothorax is identified. The visualized skeletal structures are unremarkable. IMPRESSION: Low lung volumes with mild right basilar atelectasis and/or infiltrate. Electronically Signed   By: Aram Candela M.D.   On: 01/21/2023 23:02    Procedures Procedures    Medications Ordered in ED Medications  ondansetron (ZOFRAN-ODT) disintegrating tablet 8 mg (8 mg Oral Given 01/21/23 2245)  famotidine (PEPCID) tablet 20 mg (20 mg Oral Given 01/21/23 2243)  alum & mag hydroxide-simeth (MAALOX/MYLANTA) 200-200-20 MG/5ML suspension 30 mL (30 mLs Oral Given 01/21/23 2244)  ketorolac (TORADOL) 15 MG/ML injection 15 mg (15 mg Intravenous Given 01/22/23 0117)    ED Course/ Medical Decision Making/ A&P                                 Medical Decision Making Amount and/or Complexity of Data Reviewed Labs: ordered. Radiology: ordered.   This patient presents to the ED for concern of chest pain, this involves an extensive number of treatment options, and is a complaint that carries with it a high risk of complications and morbidity.  The differential diagnosis includes ACS, PE, pneumothorax, GERD, pericarditis/myocarditis/tamponade, aortic dissection, pneumonia, skeletal, other   Co morbidities that complicate the patient evaluation  See HPI   Additional history obtained:  Additional history obtained from EMR External records from outside source obtained and reviewed including hospital records   Lab Tests:  I Ordered, and personally interpreted labs.  The pertinent results include: Initial troponin of 3 with repeat 3.  No leukocytosis.  Evidence of anemia with a hemoglobin of 10.8.  Platelets within range.  No electrolyte abnormalities besides mild hypocalcemia of 8.8.  No transaminitis.  No  renal dysfunction   Imaging Studies ordered:  I ordered imaging studies including chest x-ray I independently visualized and interpreted imaging which showed no acute cardiopulmonary abnormalities.  Low lung volumes. I agree with the radiologist interpretation   Cardiac Monitoring: / EKG:  The patient was maintained on a cardiac monitor.  I personally viewed and interpreted the cardiac monitored which showed an underlying rhythm of: Sinus rhythm with early repolarization pattern   Consultations Obtained:  N/a   Problem List / ED Course / Critical interventions / Medication management  Chest pain I ordered medication including Maalox, Zofran, Pepcid, Toradol   Reevaluation of the patient after these medicines showed that the patient resolved I have reviewed the patients home medicines and  have made adjustments as needed   Social Determinants of Health:  Former cigarette use.  Denies illicit drug use.   Test / Admission - Considered:  Chest pain Vitals signs within normal range and stable throughout visit. Laboratory/imaging studies significant for: See above 47 year old female presents emergency department with complaint of chest pain.  Chest pain began when she was sitting in her bed at night around 9 PM.  On exam, patient with mild tenderness in epigastric region and reports some radiation up into her chest.  Workup today overall reassuring.  Delta negative troponin, lack of acute ischemic change on EKG; low suspicion for ACS.  Patient with heart score of 0-3.  Patient Wells PE 0 and PERC negative; low suspicion for PE.  Patient without chest pain rating to back, pulse deficits, neurologic deficits, hypertension; low suspicion for aortic dissection.  Chest x-ray without signs of pneumonia, pneumothorax, pneumomediastinum, or other abnormality.  Patient noted resolution of symptoms with administration of GI cocktail while in the ED.  Patient with history of GERD.  Suspect that  patient's symptoms are likely secondary to GERD.  Will recommend continued lifestyle modifications as well as use of Prilosec at home as needed as she has already been prescribed this by her outpatient provider.  Will recommend follow-up with primary care/GI in the outpatient setting for reassessment.  Treatment plan discussed at length with patient and she acknowledged understanding was agreeable to said plan.  Patient overall well-appearing, afebrile in no acute distress. Worrisome signs and symptoms were discussed with the patient, and the patient acknowledged understanding to return to the ED if noticed. Patient was stable upon discharge.          Final Clinical Impression(s) / ED Diagnoses Final diagnoses:  Chest pain, unspecified type    Rx / DC Orders ED Discharge Orders     None         Peter Garter, [place] 01/22/23 0154    Melene Plan, DO 01/22/23 1524

## 2023-01-22 LAB — TROPONIN I (HIGH SENSITIVITY)
Troponin I (High Sensitivity): 3 ng/L (ref <18)
Troponin I (High Sensitivity): 3 ng/L (ref <18)

## 2023-01-22 MED ORDER — KETOROLAC TROMETHAMINE 15 MG/ML IJ SOLN
15.0000 mg | Freq: Once | INTRAMUSCULAR | Status: AC
  Administered 2023-01-22: 15 mg via INTRAVENOUS

## 2023-01-22 MED ORDER — KETOROLAC TROMETHAMINE 30 MG/ML IJ SOLN
30.0000 mg | Freq: Once | INTRAMUSCULAR | Status: DC
  Filled 2023-01-22: qty 1

## 2023-01-22 NOTE — Discharge Instructions (Addendum)
As discussed, workup today overall reassuring.  Your heart enzymes were normal.  EKG appeared normal.  Does not like you are having a heart attack.  I suspect that your symptoms are likely secondary to GERD given improvement with GI cocktail.  Will recommend continued use of your medications at home as well as lifestyle changes as discussed.  Recommend close follow-up with primary care in the outpatient setting for reassessment of your symptoms.  Please do not hesitate to return to emergency department if there are worrisome signs and symptoms we discussed become apparent.

## 2023-05-19 ENCOUNTER — Other Ambulatory Visit: Payer: Self-pay | Admitting: Obstetrics and Gynecology

## 2023-05-19 DIAGNOSIS — R928 Other abnormal and inconclusive findings on diagnostic imaging of breast: Secondary | ICD-10-CM

## 2023-05-27 ENCOUNTER — Ambulatory Visit: Payer: Self-pay | Attending: Cardiovascular Disease | Admitting: Cardiovascular Disease

## 2023-05-28 ENCOUNTER — Encounter: Payer: Self-pay | Admitting: Cardiovascular Disease

## 2023-07-28 ENCOUNTER — Other Ambulatory Visit

## 2023-07-28 ENCOUNTER — Encounter

## 2023-08-04 ENCOUNTER — Ambulatory Visit
Admission: RE | Admit: 2023-08-04 | Discharge: 2023-08-04 | Disposition: A | Discharge status: Home / Self Care | Source: Ambulatory Visit | Attending: Obstetrics and Gynecology | Admitting: Obstetrics and Gynecology

## 2023-08-04 ENCOUNTER — Ambulatory Visit

## 2023-08-04 DIAGNOSIS — R928 Other abnormal and inconclusive findings on diagnostic imaging of breast: Secondary | ICD-10-CM

## 2024-01-19 ENCOUNTER — Encounter: Payer: Self-pay | Admitting: Family Medicine

## 2024-01-19 ENCOUNTER — Ambulatory Visit: Admitting: Family Medicine

## 2024-01-19 VITALS — BP 164/97 | HR 91 | Ht 65.0 in | Wt 370.0 lb

## 2024-01-19 DIAGNOSIS — R030 Elevated blood-pressure reading, without diagnosis of hypertension: Secondary | ICD-10-CM | POA: Diagnosis not present

## 2024-01-19 DIAGNOSIS — G932 Benign intracranial hypertension: Secondary | ICD-10-CM

## 2024-01-19 DIAGNOSIS — K219 Gastro-esophageal reflux disease without esophagitis: Secondary | ICD-10-CM

## 2024-01-19 DIAGNOSIS — E66813 Obesity, class 3: Secondary | ICD-10-CM | POA: Diagnosis not present

## 2024-01-19 DIAGNOSIS — Z6841 Body Mass Index (BMI) 40.0 and over, adult: Secondary | ICD-10-CM

## 2024-01-19 NOTE — Progress Notes (Signed)
 [facility] [address] [place], [place] [address] Office: [phone]  /  Fax: [phone]   Initial Visit  Katherine Baldwin was seen in clinic today to evaluate for obesity. She is interested in losing weight to improve overall health and reduce the risk of weight related complications. She presents today to review program treatment options, initial physical assessment, and evaluation.     She was referred by: Friend or Family  When asked what else they would like to accomplish? She states: Adopt a healthier eating pattern and lifestyle, Improve energy levels and physical activity, Improve existing medical conditions, Improve quality of life, and Improve appearance  Weight history: had VSG with [facility] 01-29-2012, Dr Verta Pre op weight max 407 lb--> 259 lb nadir Regained weight after pregnancy in 29-Jan-2015, change in job, dad died Finds herself snacking more in the evenings Denies meal skipping but struggles with food choices Has considered DS with [facility] She was seeing a dietician, tried topiramate Insurance didn't cover AOMs VSG still provides some restrictons  She is a runner, broadcasting/film/video; 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 She states: Contributed to medical problems and Contributed to orthopedic problems or mobility issues  Some associated conditions: Other: Pseudotumor  Contributing factors: family history of obesity, consumption of processed foods, moderate to high levels of stress, reduced physical activity, and multiple weight loss attempts in the past  Weight promoting medications identified: None  Current nutrition plan: None  Current level of physical activity: Limited due to chronic pain or orthopedic problems  Current or previous pharmacotherapy: Is interested in pharmacotherapy and Topiramate  Response to medication: Never tried  medications   Past medical history includes:   Past Medical History:  Diagnosis Date   Abnormal Pap smear 1998   no treatment required   Arthritis    knees   Bronchitis    hx of   Chicken pox    Family history of anesthesia complication    N/V, extreme chills-father   GERD (gastroesophageal reflux disease)    mild   GERD (gastroesophageal reflux disease)    Headache(784.0)    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  Peak Weight:401 , Body Fat%:58.8, Visceral Fat Rating:26, Weight trend over the last 12 months: Increasing  General:  Alert, oriented and cooperative. Patient is in no acute distress.  Respiratory: Normal respiratory effort, no problems with respiration noted   Gait: able to ambulate independently  Mental Status: Normal mood and affect. Normal behavior. Normal judgment and thought content.   DIAGNOSTIC DATA REVIEWED:  BMET    Component Value Date/Time   NA 137 01/21/2023 2223-01-29   K 3.8 01/21/2023 01-29-2223   CL 105 01/21/2023 2224   CO2 22 01/21/2023 01/29/23   GLUCOSE 95 01/21/2023 29-Jan-2223   BUN 20 01/21/2023 2223-01-29   CREATININE 0.77  01/21/2023 2224   CREATININE 0.66 09/30/2012 1353   CALCIUM  8.8 (L) 01/21/2023 2224   GFRNONAA >60 01/21/2023 2224   GFRAA >60 11/26/2016 2102   No results found for: HGBA1C No results found for: INSULIN  CBC    Component Value Date/Time   WBC 8.8 01/21/2023 2224   RBC 3.96 01/21/2023 2224   HGB 10.8 (L) 01/21/2023 2224   HGB 12.9 03/28/2013 1615   HCT 35.4 (L) 01/21/2023 2224   PLT 294 01/21/2023 2224   MCV  89.4 01/21/2023 2224   MCH 27.3 01/21/2023 2224   MCHC 30.5 01/21/2023 2224   RDW 13.2 01/21/2023 2224   Iron/TIBC/Ferritin/ %Sat    Component Value Date/Time   IRON 35 (L) 12/02/2012 1520   TIBC 333 12/02/2012 1520   IRONPCTSAT 11 (L) 12/02/2012 1520   Lipid Panel     Component Value Date/Time   CHOL 143 02/14/2012 1126   TRIG 82 02/14/2012 1126   HDL 39 (L) 02/14/2012 1126   CHOLHDL 3.7 02/14/2012 1126   VLDL 16 02/14/2012 1126   LDLCALC 88 02/14/2012 1126   Hepatic Function Panel     Component Value Date/Time   PROT 6.4 (L) 01/21/2023 2224   ALBUMIN 3.1 (L) 01/21/2023 2224   AST 18 01/21/2023 2224   ALT 15 01/21/2023 2224   ALKPHOS 54 01/21/2023 2224   BILITOT 0.3 01/21/2023 2224      Component Value Date/Time   TSH 0.594 02/14/2012 1126     Assessment and Plan:   Gastroesophageal reflux disease without esophagitis Worsened post VSG Taking Omeprazole 40 mg daily Had GI workup with [facility] Look for improvements with weight loss Consider revision bariatric surgery  Class 3 severe obesity due to excess calories with serious comorbidity and body mass index (BMI) of 60.0 to 69.9 in adult (HCC)  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 Will work on eliminating or reducing the presence of highly palatable, calorie dense foods in the home. Will complete provided nutritional and psychosocial assessment questionnaire before the next appointment. Will be scheduled for indirect calorimetry to determine resting energy expenditure in a fasting state.  This will allow  us  to create a reduced calorie, high-protein meal plan to promote loss of fat mass while preserving muscle mass. Will think about ideas on how to incorporate physical activity into their daily routine. Was counseled on nutritional approaches to weight loss and benefits of reducing processed foods and consuming plant-based foods and high quality protein as part of nutritional weight management. Was counseled on pharmacotherapy and role as an adjunct in weight management.   Obesity Education Performed Today:  She was weighed on the bioimpedance scale and results were discussed and documented in the synopsis.  We discussed obesity as a disease and the importance of a more detailed evaluation of all the factors contributing to the disease.  We discussed the importance of long term lifestyle changes which include nutrition, exercise and behavioral modifications as well as the importance of customizing this to her specific health and social needs.  We discussed the benefits of reaching a  healthier weight to alleviate the symptoms of existing conditions and reduce the risks of the biomechanical, metabolic and psychological effects of obesity.  Katherine Baldwin appears to be in the action stage of change and states they are ready to start intensive lifestyle modifications and behavioral modifications.  22 minutes was spent today on this visit including the above counseling, pre-visit chart review, and post-visit documentation.  Reviewed by clinician on day of visit: allergies, medications, problem list, medical history, surgical history, family history, social history, and previous encounter notes pertinent to obesity diagnosis.    Darice Haddock, D.O. DABFM, [facility] [address] [place], [place] [address] [phone]

## 2024-02-09 ENCOUNTER — Encounter: Payer: Self-pay | Admitting: Cardiology

## 2024-02-09 ENCOUNTER — Ambulatory Visit: Attending: Cardiology

## 2024-02-09 ENCOUNTER — Encounter: Payer: Self-pay | Admitting: Family Medicine

## 2024-02-09 ENCOUNTER — Ambulatory Visit: Attending: Cardiology | Admitting: Cardiology

## 2024-02-09 ENCOUNTER — Ambulatory Visit: Admitting: Family Medicine

## 2024-02-09 VITALS — BP 111/73 | HR 89 | Wt 373.5 lb

## 2024-02-09 VITALS — BP 149/80 | HR 72 | Temp 97.7°F | Ht 65.0 in | Wt 371.0 lb

## 2024-02-09 DIAGNOSIS — H05119 Granuloma of unspecified orbit: Secondary | ICD-10-CM | POA: Insufficient documentation

## 2024-02-09 DIAGNOSIS — Z7689 Persons encountering health services in other specified circumstances: Secondary | ICD-10-CM

## 2024-02-09 DIAGNOSIS — K912 Postsurgical malabsorption, not elsewhere classified: Secondary | ICD-10-CM | POA: Diagnosis not present

## 2024-02-09 DIAGNOSIS — Z6841 Body Mass Index (BMI) 40.0 and over, adult: Secondary | ICD-10-CM

## 2024-02-09 DIAGNOSIS — B019 Varicella without complication: Secondary | ICD-10-CM | POA: Insufficient documentation

## 2024-02-09 DIAGNOSIS — I441 Atrioventricular block, second degree: Secondary | ICD-10-CM

## 2024-02-09 DIAGNOSIS — Z1331 Encounter for screening for depression: Secondary | ICD-10-CM

## 2024-02-09 DIAGNOSIS — R011 Cardiac murmur, unspecified: Secondary | ICD-10-CM | POA: Diagnosis not present

## 2024-02-09 DIAGNOSIS — I440 Atrioventricular block, first degree: Secondary | ICD-10-CM | POA: Diagnosis not present

## 2024-02-09 DIAGNOSIS — R002 Palpitations: Secondary | ICD-10-CM | POA: Insufficient documentation

## 2024-02-09 DIAGNOSIS — K219 Gastro-esophageal reflux disease without esophagitis: Secondary | ICD-10-CM

## 2024-02-09 DIAGNOSIS — R5383 Other fatigue: Secondary | ICD-10-CM

## 2024-02-09 DIAGNOSIS — R0602 Shortness of breath: Secondary | ICD-10-CM

## 2024-02-09 DIAGNOSIS — F419 Anxiety disorder, unspecified: Secondary | ICD-10-CM | POA: Insufficient documentation

## 2024-02-09 DIAGNOSIS — M2550 Pain in unspecified joint: Secondary | ICD-10-CM | POA: Insufficient documentation

## 2024-02-09 DIAGNOSIS — G932 Benign intracranial hypertension: Secondary | ICD-10-CM | POA: Diagnosis not present

## 2024-02-09 DIAGNOSIS — M1990 Unspecified osteoarthritis, unspecified site: Secondary | ICD-10-CM | POA: Insufficient documentation

## 2024-02-09 DIAGNOSIS — Z889 Allergy status to unspecified drugs, medicaments and biological substances status: Secondary | ICD-10-CM | POA: Insufficient documentation

## 2024-02-09 DIAGNOSIS — Z9884 Bariatric surgery status: Secondary | ICD-10-CM | POA: Insufficient documentation

## 2024-02-09 DIAGNOSIS — Z8489 Family history of other specified conditions: Secondary | ICD-10-CM | POA: Insufficient documentation

## 2024-02-09 DIAGNOSIS — Z8719 Personal history of other diseases of the digestive system: Secondary | ICD-10-CM | POA: Insufficient documentation

## 2024-02-09 DIAGNOSIS — M549 Dorsalgia, unspecified: Secondary | ICD-10-CM | POA: Insufficient documentation

## 2024-02-09 DIAGNOSIS — J40 Bronchitis, not specified as acute or chronic: Secondary | ICD-10-CM | POA: Insufficient documentation

## 2024-02-09 NOTE — Patient Instructions (Signed)
 Medication Instructions:  Your physician recommends that you continue on your current medications as directed. Please refer to the Current Medication list given to you today.  *If you need a refill on your cardiac medications before your next appointment, please call your pharmacy*  Lab Work: None If you have labs (blood work) drawn today and your tests are completely normal, you will receive your results only by: MyChart Message (if you have MyChart) OR A paper copy in the mail If you have any lab test that is abnormal or we need to change your treatment, we will call you to review the results.  Testing/Procedures: A zio monitor was ordered today. It will remain on for 14 days. You will then return monitor and event diary in provided box. It takes 1-2 weeks for report to be downloaded and returned to us . We will call you with the results. If monitor falls off or has orange flashing light, please call Zio for further instructions.   Your physician has requested that you have an echocardiogram. Echocardiography is a painless test that uses sound waves to create images of your heart. It provides your doctor with information about the size and shape of your heart and how well your heart's chambers and valves are working. This procedure takes approximately one hour. There are no restrictions for this procedure. Please do NOT wear cologne, perfume, aftershave, or lotions (deodorant is allowed). Please arrive 15 minutes prior to your appointment time.  Please note: We ask at that you not bring children with you during ultrasound (echo/ vascular) testing. Due to room size and safety concerns, children are not allowed in the ultrasound rooms during exams. Our front office staff cannot provide observation of children in our lobby area while testing is being conducted. An adult accompanying a patient to their appointment will only be allowed in the ultrasound room at the discretion of the ultrasound  technician under special circumstances. We apologize for any inconvenience.   Follow-Up: At [facility], you and your health needs are our priority.  As part of our continuing mission to provide you with exceptional heart care, our providers are all part of one team.  This team includes your primary Cardiologist (physician) and Advanced Practice Providers or APPs (Physician Assistants and Nurse Practitioners) who all work together to provide you with the care you need, when you need it.  Your next appointment:   9 month(s)  Provider:   Jennifer Crape, MD    We recommend signing up for the patient portal called MyChart.  Sign up information is provided on this After Visit Summary.  MyChart is used to connect with patients for Virtual Visits (Telemedicine).  Patients are able to view lab/test results, encounter notes, upcoming appointments, etc.  Non-urgent messages can be sent to your provider as well.   To learn more about what you can do with MyChart, go to [email or web address].   Other Instructions None

## 2024-02-09 NOTE — Progress Notes (Signed)
 At a Glance:  Vitals Temp: 97.7 F (36.5 C) BP: (!) 149/80 Pulse Rate: 72 SpO2: 100 %   Anthropometric Measurements Height: 5' 5 (1.651 m) Weight: (!) 371 lb (168.3 kg) BMI (Calculated): 61.74 Starting Weight: 371lb Peak Weight: 401lb   Body Composition  Body Fat %: 59 % Fat Mass (lbs): 219.4 lbs Muscle Mass (lbs): 144.8 lbs Visceral Fat Rating : 26   Other Clinical Data RMR: 2275 Fasting: Yes Labs: Yes Today's Visit #: 1 Starting Date: 02/09/24    EKG: Normal sinus rhythm, rate 68 BPM with 2nd degree AVB Mobitz type I.  Indirect Calorimeter completed today shows a VO2 of 330 and a REE of 2275.  Her calculated basal metabolic rate is 7688 thus her basal metabolic rate is worse than expected.  Chief Complaint:  Obesity   Subjective:  Katherine Baldwin (MR# [id number]) is a 48 y.o. female who presents for evaluation and treatment of obesity and related comorbidities.   [name] is currently in the action stage of change and ready to dedicate time achieving and maintaining a healthier weight. [name] is interested in becoming our patient and working on intensive lifestyle modifications including (but not limited to) diet and exercise for weight loss.  [name] has been struggling with her weight. She has been unsuccessful in either losing weight, maintaining weight loss, or reaching her healthy weight goal.she has been overweight since childhood with a + fam hx of obesity.  She is s/p VSG with Dr Verta at [facility] in 2013.  Pre op weight was 407 lb and nadir weight 259 lb.  She started to regain weight in 2016 after pregnancy and the passing of her dad.  She is a runner, broadcasting/film/video and in grad school.  Her husband is supportive and she has a 109 yo daughter at home.    [name]'s habits were reviewed today and are as follows: her desired weight loss is 170 lb, she has been heavy most of her life, she has problems with excessive hunger, and she struggles with emotional eating.  She has some  volume control with meals from her sleeve.  Her acid reflux has worsened.  She has a small HH and still has breakthrough reflux on daily PPI and H2B.  She admits to poor snack choices.  Other Fatigue Valbona denies daytime somnolence and denies waking up still tired. Patient has a history of symptoms of hypertension. Travis generally gets 6 or 7 hours of sleep per night, and states that she has generally restful sleep other than waking up to void. Snoring is present. Apneic episodes are not present. Epworth Sleepiness Score is 5. Previously tested neg for OSA.  Shortness of Breath Elianah notes increasing shortness of breath with exercising and seems to be worsening over time with weight gain. She notes getting out of breath sooner with activity than she used to. This has gotten worse recently. Shantel denies shortness of breath at rest or orthopnea.   Depression Screen Jeanne's Food and Mood (modified PHQ-9) score was 13.     02/09/2024    9:51 AM  Depression screen PHQ 2/9  Decreased Interest 1  Down, Depressed, Hopeless 0  PHQ - 2 Score 1  Altered sleeping 1  Tired, decreased energy 2  Change in appetite 0  Feeling bad or failure about yourself  0  Trouble concentrating 0  Moving slowly or fidgety/restless 0  Suicidal thoughts 0  PHQ-9 Score 4  Difficult doing work/chores Very difficult     Assessment  and Plan:   Other Fatigue [name] does feel that her weight is causing her energy to be lower than it should be. Fatigue may be related to obesity, depression or many other causes. Labs will be ordered, and in the meanwhile, [name] will focus on self care including making healthy food choices, increasing physical activity and focusing on stress reduction.  Shortness of Breath [name] does feel that she gets out of breath more easily that she used to when she exercises. [name]'s shortness of breath appears to be obesity related and exercise induced. She has agreed to work on weight loss  and gradually increase exercise to treat her exercise induced shortness of breath. Will continue to monitor closely.  [name] had a positive depression screening. Depression is commonly associated with obesity and often results in emotional eating behaviors. We will monitor this closely and work on CBT to help improve the non-hunger eating patterns. Referral to Psychology may be required if no improvement is seen as she continues in our clinic.    Problem List Items Addressed This Visit     Morbid obesity (HCC)   GERD (gastroesophageal reflux disease) Worsened with weight regain s/p VSG Has some breakthrough even on daily PPI Last EGD done 07/24/21 with [facility] Consider revision surgery to DS or RYGB surgery   Relevant Medications   famotidine  (PEPCID ) 40 MG tablet   S/P laparoscopic sleeve gastrectomy Has some volume restriction but appears to be overeating with snacks and calorie dense foods   Other Visit Diagnoses       SOBOE (shortness of breath on exertion)    -  Primary     Other fatigue       Relevant Orders   EKG 12-Lead (Completed)   VITAMIN D  25 Hydroxy (Vit-D Deficiency, Fractures)   TSH   T4, free   T3   Lipid panel   Insulin , random   Hemoglobin A1c   Folate   Comprehensive metabolic panel with GFR   CBC with Differential/Platelet     Depression screen         Postoperative intestinal malabsorption     Taking a MVI daily Has had low iron in past   Relevant Orders   VITAMIN D  25 Hydroxy (Vit-D Deficiency, Fractures)   Folate   Vitamin B12   CBC with Differential/Platelet   Ferritin   Iron and TIBC   Vitamin B1   Prealbumin     Second degree AV block, Mobitz type I     New finding and not present on EKG from 2024   Relevant Orders   Ambulatory referral to Cardiology     BMI 60.0-69.9, adult (HCC)         Pseudotumor cerebri   Look for improvements with weight loss          [name] is currently in the action stage of change and her goal is to  get back to weightloss efforts . I recommend [name] begin the structured treatment plan as follows:  She has agreed to Category 3 Plan  Exercise goals: All adults should avoid inactivity. Some activity is better than none, and adults who participate in any amount of physical activity, gain some health benefits.  Behavioral modification strategies:increasing lean protein intake, decreasing simple carbohydrates, increasing vegetables, increase H2O intake, decreasing eating out, keeping healthy foods in the home, better snacking choices, and decrease junk food   She was informed of the importance of frequent follow-up visits to maximize her success with intensive  lifestyle modifications for her multiple health conditions. She was informed we would discuss her lab results at her next visit unless there is a critical issue that needs to be addressed sooner. Nikiyah agreed to keep her next visit at the agreed upon time to discuss these results.  Objective:  General: Cooperative, alert, well developed, in no acute distress. HEENT: Conjunctivae and lids unremarkable. Cardiovascular: Regular rhythm.  Lungs: Normal work of breathing. Neurologic: No focal deficits.   Lab Results  Component Value Date   CREATININE 0.77 01/21/2023   BUN 20 01/21/2023   NA 137 01/21/2023   K 3.8 01/21/2023   CL 105 01/21/2023   CO2 22 01/21/2023   Lab Results  Component Value Date   ALT 15 01/21/2023   AST 18 01/21/2023   ALKPHOS 54 01/21/2023   BILITOT 0.3 01/21/2023   No results found for: HGBA1C No results found for: INSULIN  Lab Results  Component Value Date   TSH 0.594 02/14/2012   Lab Results  Component Value Date   CHOL 143 02/14/2012   HDL 39 (L) 02/14/2012   LDLCALC 88 02/14/2012   TRIG 82 02/14/2012   CHOLHDL 3.7 02/14/2012   Lab Results  Component Value Date   WBC 8.8 01/21/2023   HGB 10.8 (L) 01/21/2023   HCT 35.4 (L) 01/21/2023   MCV 89.4 01/21/2023   PLT 294 01/21/2023   Lab  Results  Component Value Date   IRON 35 (L) 12/02/2012   TIBC 333 12/02/2012    Attestation Statements:  Reviewed by clinician on day of visit: allergies, medications, problem list, medical history, surgical history, family history, social history, and previous encounter notes.  Time spent on visit including pre-visit chart review and post-visit charting and face- to face care including nutritional counseling, review of EKG, interpretation of body composition scale and indirect calorimetry and nutrition prescription  was 45 minutes.   Darice Haddock, D.O. DABFM, DABOM [facility] [address] [place], [place] [address] [phone]

## 2024-02-09 NOTE — Progress Notes (Signed)
 Cardiology Office Note:    Date:  02/09/2024   ID:  Katherine Baldwin, DOB 09-01-1975, MRN [id number]  PCP:  Jacquline Rogue, MD (Inactive)  Cardiologist:  Jennifer JONELLE Crape, MD   Referring MD: Waylan Darice BRAVO, DO    ASSESSMENT:    1. Encounter to establish care   2. First degree AV block   3. Morbid obesity (HCC)   4. Palpitations   5. Cardiac murmur    PLAN:    In order of problems listed above:  Primary prevention stressed with the patient.  Importance of compliance with diet medication stressed and patient verbalized standing. She was advised to ambulate to the best of her ability. Mobitz type I AV block: Stable at this time but asymptomatic.  Will continue to monitor.  Her TSH is normal. Cardiac murmur: Echocardiogram will be done to assess murmur heard on auscultation. Palpitations: Will do a 2-week monitor to assess this. Morbid obesity: Weight reduction stressed diet emphasized and she promises to do better.  She is at weight loss clinic  at [facility] and seems to be motivated. Patient will be seen in follow-up appointment in 6 months or earlier if the patient has any concerns.    Medication Adjustments/Labs and Tests Ordered: Current medicines are reviewed at length with the patient today.  Concerns regarding medicines are outlined above.  Orders Placed This Encounter  Procedures   EKG 12-Lead   No orders of the defined types were placed in this encounter.    History of Present Illness:    Katherine Baldwin is a 48 y.o. female who is being seen today for the evaluation of Mobitz type I second-degree AV block at the request of Bowen, Darice BRAVO, DO.  Patient is a pleasant 48 year old female.  She has past medical history of morbid obesity.  She has had weight loss clinic.  She mentions to me that she was found to have Mobitz 1 block and sent here for evaluation.  She denies any chest pain orthopnea PND dizziness or syncope.  At the time of my evaluation, the patient is  alert awake oriented and in no distress.  She occasionally has palpitations which are not causing get the other symptoms such as dizziness.  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 MSH6 VUS.  Genetic testing performed on the OvaNext panel testing through [employer]  Genes tested include: ATM, BARD1, BRCA1, BRCA2, BRIP1, CDH1, CHEK2, EPCAM, MLH1, MRE11A, MSH2, MSH6, MUTYH, NBN, NF1, PALB2, PMS2, PTEN, RAD50, RAD51C, RAD51D, SMARCA4, STK11, and TP53. Report date is: February 21, 2014.     Amended report:  MSH6 p.D21   GERD (gastroesophageal reflux disease)    mild   GERD (gastroesophageal reflux disease)    History of hiatal hernia    Increased risk of breast cancer 12/19/2013   Joint pain    Menorrhagia 07/30/2011   Morbid obesity (HCC) 07/30/2011   Morbid obesity with body mass index of 60.0-69.9 in adult [facility])    Multiple allergies    Orbital pseudotumor    Significant discrepancy between uterine size  and clinical dates, antepartum    Small for gestational age 57/26/2016   Trichomoniasis    IMO SNOMED Dx Update Oct 2024      Past Surgical History:  Procedure Laterality Date   CHOLECYSTECTOMY N/A 11/19/2016   Procedure: LAPAROSCOPIC CHOLECYSTECTOMY WITH INTRAOPERATIVE CHOLANGIOGRAM;  Surgeon: Aron Shoulders, MD;  Location: [facility] OR;  Service: General;  Laterality: N/A;   LAPAROSCOPIC GASTRIC SLEEVE RESECTION N/A 08/31/2012   Procedure: LAPAROSCOPIC GASTRIC SLEEVE RESECTION;  Surgeon: Redell Faith, DO;  Location: [facility] ORS;  Service: General;  Laterality: N/A;  Laparoscopic Sleeve Gastrectomy with EGD   spinal tap      Current  Medications: Current Meds  Medication Sig   cetirizine (ZYRTEC) 10 MG tablet Take 10 mg by mouth daily as needed for allergies.   famotidine  (PEPCID ) 40 MG tablet Take 40 mg by mouth.   Multiple Vitamins-Minerals (MULTIVITAMIN WITH MINERALS) tablet Take 1 tablet by mouth daily.   omeprazole (PRILOSEC) 40 MG capsule Take 40 mg by mouth every morning.   VITAMIN D  PO Take 1 tablet by mouth 4 (four) times a week.     Allergies:   Fruit & vegetable daily [nutritional supplements] and Peanuts [peanut oil]   Social History   Socioeconomic History   Marital status: Married    Spouse name: Not on file   Number of children: 0   Years of education: 16   Highest education level: Not on file  Occupational History   Occupation: Magazine Features Editor: [employer]  Tobacco Use   Smoking status: Former    Current packs/day: 0.00    Types: Cigarettes    Quit date: 03/10/2010    Years since quitting: 13.9   Smokeless tobacco: Never  Substance and Sexual Activity   Alcohol use: No    Alcohol/week: 0.0 standard drinks of alcohol   Drug use: No   Sexual activity: Yes    Partners: Male    Birth control/protection: None  Other Topics Concern   Not on file  Social History Narrative   Not on file   Social Drivers of Health   Financial Resource Strain: Not on file  Food Insecurity: Not on file  Transportation Needs: Not on file  Physical Activity: Not on file  Stress: Not on file  Social Connections: Not on file     Family History: The patient's family history includes Arthritis in her father; Asthma in her mother; Breast cancer (age of onset: 31) in her father; Cancer in her father; Cancer - Other (age of onset: 78) in her father; Diabetes in her father, maternal grandfather, maternal grandmother, mother, paternal grandfather, and paternal grandmother; Fibroids in her sister; Hypertension in her father and mother; Obesity in her father and mother; Prostate cancer (age of onset: 16)  in her father; Stomach cancer (age of onset: 98) in her father; Stroke in her mother; Thyroid disease in her sister.  ROS:   Please see the history of present illness.    All other systems reviewed and are negative.  EKGs/Labs/Other Studies Reviewed:    The following studies were reviewed today:  EKG Interpretation Date/Time:  Tuesday February 09 2024 14:53:39 EST Ventricular Rate:  89 PR Interval:  170 QRS Duration:  82 QT Interval:  346 QTC Calculation: 420 R Axis:   -15  Text Interpretation: Normal sinus rhythm Low voltage QRS Cannot rule out Anterior infarct , age undetermined When compared with ECG of 22-Jan-2023 00:03, PREVIOUS ECG IS PRESENT Confirmed by Edwyna Backers (  [id number]) on 02/09/2024 2:59:05 PM     Recent Labs: No results found for requested labs within last 365 days.  Recent Lipid Panel    Component Value Date/Time   CHOL 143 02/14/2012 1126   TRIG 82 02/14/2012 1126   HDL 39 (L) 02/14/2012 1126   CHOLHDL 3.7 02/14/2012 1126   VLDL 16 02/14/2012 1126   LDLCALC 88 02/14/2012 1126    Physical Exam:    VS:  BP 111/73   Pulse 89   Wt (!) 373 lb 8 oz (169.4 kg)   SpO2 99%   BMI 62.15 kg/m     Wt Readings from Last 3 Encounters:  02/09/24 (!) 373 lb 8 oz (169.4 kg)  02/09/24 (!) 371 lb (168.3 kg)  01/19/24 (!) 370 lb (167.8 kg)     GEN: Patient is in no acute distress HEENT: Normal NECK: No JVD; No carotid bruits LYMPHATICS: No lymphadenopathy CARDIAC: S1 S2 regular, 2/6 systolic murmur at the apex. RESPIRATORY:  Clear to auscultation without rales, wheezing or rhonchi  ABDOMEN: Soft, non-tender, non-distended MUSCULOSKELETAL:  No edema; No deformity  SKIN: Warm and dry NEUROLOGIC:  Alert and oriented x 3 PSYCHIATRIC:  Normal affect    Signed, Jennifer JONELLE Crape, MD  02/09/2024 3:16 PM    [facility]

## 2024-02-10 ENCOUNTER — Other Ambulatory Visit: Payer: Self-pay | Admitting: Cardiology

## 2024-02-10 DIAGNOSIS — I440 Atrioventricular block, first degree: Secondary | ICD-10-CM

## 2024-02-10 DIAGNOSIS — R011 Cardiac murmur, unspecified: Secondary | ICD-10-CM

## 2024-02-10 DIAGNOSIS — Z7689 Persons encountering health services in other specified circumstances: Secondary | ICD-10-CM

## 2024-02-10 DIAGNOSIS — R002 Palpitations: Secondary | ICD-10-CM

## 2024-02-14 LAB — IRON AND TIBC
Iron Saturation: 17 % (ref 15–55)
Iron: 77 ug/dL (ref 27–159)
Total Iron Binding Capacity: 452 ug/dL — ABNORMAL HIGH (ref 250–450)
UIBC: 375 ug/dL (ref 131–425)

## 2024-02-14 LAB — VITAMIN D 25 HYDROXY (VIT D DEFICIENCY, FRACTURES): Vit D, 25-Hydroxy: 26.8 ng/mL — ABNORMAL LOW (ref 30.0–100.0)

## 2024-02-14 LAB — INSULIN, RANDOM: INSULIN: 18.1 u[IU]/mL (ref 2.6–24.9)

## 2024-02-14 LAB — CBC WITH DIFFERENTIAL/PLATELET
Basophils Absolute: 0.1 x10E3/uL (ref 0.0–0.2)
Basos: 1 %
EOS (ABSOLUTE): 0.1 x10E3/uL (ref 0.0–0.4)
Eos: 1 %
Hematocrit: 35.9 % (ref 34.0–46.6)
Hemoglobin: 11.7 g/dL (ref 11.1–15.9)
Immature Grans (Abs): 0 x10E3/uL (ref 0.0–0.1)
Immature Granulocytes: 0 %
Lymphocytes Absolute: 2.4 x10E3/uL (ref 0.7–3.1)
Lymphs: 33 %
MCH: 26.7 pg (ref 26.6–33.0)
MCHC: 32.6 g/dL (ref 31.5–35.7)
MCV: 82 fL (ref 79–97)
Monocytes Absolute: 0.5 x10E3/uL (ref 0.1–0.9)
Monocytes: 7 %
Neutrophils Absolute: 4.2 x10E3/uL (ref 1.4–7.0)
Neutrophils: 58 %
Platelets: 351 x10E3/uL (ref 150–450)
RBC: 4.38 x10E6/uL (ref 3.77–5.28)
RDW: 14.2 % (ref 11.7–15.4)
WBC: 7.2 x10E3/uL (ref 3.4–10.8)

## 2024-02-14 LAB — COMPREHENSIVE METABOLIC PANEL WITH GFR
ALT: 16 IU/L (ref 0–32)
AST: 19 IU/L (ref 0–40)
Albumin: 4.1 g/dL (ref 3.9–4.9)
Alkaline Phosphatase: 75 IU/L (ref 41–116)
BUN/Creatinine Ratio: 17 (ref 9–23)
BUN: 13 mg/dL (ref 6–24)
Bilirubin Total: 0.3 mg/dL (ref 0.0–1.2)
CO2: 24 mmol/L (ref 20–29)
Calcium: 9.3 mg/dL (ref 8.7–10.2)
Chloride: 101 mmol/L (ref 96–106)
Creatinine, Ser: 0.75 mg/dL (ref 0.57–1.00)
Globulin, Total: 2.8 g/dL (ref 1.5–4.5)
Glucose: 85 mg/dL (ref 70–99)
Potassium: 4.8 mmol/L (ref 3.5–5.2)
Sodium: 138 mmol/L (ref 134–144)
Total Protein: 6.9 g/dL (ref 6.0–8.5)
eGFR: 99 mL/min/1.73 (ref >59)

## 2024-02-14 LAB — VITAMIN B12: Vitamin B-12: 422 pg/mL (ref 232–1245)

## 2024-02-14 LAB — TSH: TSH: 0.506 u[IU]/mL (ref 0.450–4.500)

## 2024-02-14 LAB — FOLATE: Folate: >20 ng/mL (ref >3.0)

## 2024-02-14 LAB — HEMOGLOBIN A1C
Est. average glucose Bld gHb Est-mCnc: 123 mg/dL
Hgb A1c MFr Bld: 5.9 % — ABNORMAL HIGH (ref 4.8–5.6)

## 2024-02-14 LAB — VITAMIN B1: Thiamine: 115.5 nmol/L (ref 66.5–200.0)

## 2024-02-14 LAB — LIPID PANEL
Chol/HDL Ratio: 3.3 ratio (ref 0.0–4.4)
Cholesterol, Total: 160 mg/dL (ref 100–199)
HDL: 49 mg/dL (ref >39)
LDL Chol Calc (NIH): 92 mg/dL (ref 0–99)
Triglycerides: 106 mg/dL (ref 0–149)
VLDL Cholesterol Cal: 19 mg/dL (ref 5–40)

## 2024-02-14 LAB — PREALBUMIN: PREALBUMIN: 23 mg/dL (ref 12–34)

## 2024-02-14 LAB — T3: T3, Total: 138 ng/dL (ref 71–180)

## 2024-02-14 LAB — T4, FREE: Free T4: 0.97 ng/dL (ref 0.82–1.77)

## 2024-02-14 LAB — FERRITIN: Ferritin: 14 ng/mL — ABNORMAL LOW (ref 15–150)

## 2024-02-15 ENCOUNTER — Ambulatory Visit: Payer: Self-pay | Admitting: Family Medicine

## 2024-02-23 ENCOUNTER — Encounter: Payer: Self-pay | Admitting: Family Medicine

## 2024-02-23 ENCOUNTER — Ambulatory Visit: Admitting: Family Medicine

## 2024-02-23 VITALS — BP 131/81 | HR 81 | Temp 98.0°F | Ht 65.0 in | Wt 362.0 lb

## 2024-02-23 DIAGNOSIS — I441 Atrioventricular block, second degree: Secondary | ICD-10-CM

## 2024-02-23 DIAGNOSIS — Z6841 Body Mass Index (BMI) 40.0 and over, adult: Secondary | ICD-10-CM

## 2024-02-23 DIAGNOSIS — E88819 Insulin resistance, unspecified: Secondary | ICD-10-CM

## 2024-02-23 DIAGNOSIS — E559 Vitamin D deficiency, unspecified: Secondary | ICD-10-CM

## 2024-02-23 DIAGNOSIS — Z9884 Bariatric surgery status: Secondary | ICD-10-CM

## 2024-02-23 DIAGNOSIS — R7303 Prediabetes: Secondary | ICD-10-CM

## 2024-02-23 DIAGNOSIS — E611 Iron deficiency: Secondary | ICD-10-CM

## 2024-02-23 MED ORDER — CHOLECALCIFEROL 100 MCG (4000 UT) PO CAPS: ORAL_CAPSULE | ORAL | Status: AC

## 2024-02-23 MED ORDER — FERROUS GLUCONATE 324 (38 FE) MG PO TABS: 324.0000 mg | ORAL_TABLET | Freq: Every day | ORAL | 1 refills | Status: AC

## 2024-02-23 NOTE — Progress Notes (Signed)
 Office: [phone]  /  Fax: [phone]  WEIGHT SUMMARY AND BIOMETRICS  Starting Date: 02/09/24  Starting Weight: 371lb   Weight Lost Since Last Visit: 9lb   Vitals Temp: 98 F (36.7 C) BP: 131/81 Pulse Rate: 81 SpO2: 100 %   Body Composition  Body Fat %: 57.5 % Fat Mass (lbs): 208.6 lbs Muscle Mass (lbs): 146.4 lbs Total Body Water (lbs): 111.8 lbs Visceral Fat Rating : 25     HPI  Chief Complaint: OBESITY  Katherine Baldwin is here to discuss her progress with her obesity treatment plan. She is on the the Category 2 Plan and states she is following her eating plan approximately 75-80 % of the time. She states she went to the gym 1 time.  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 Body mass index is 60.24 kg/m.  General: She is overweight, cooperative, alert, well developed, and in no acute distress. PSYCH: Has normal mood, affect and thought process.   Lungs: Normal breathing effort, no conversational dyspnea.   ASSESSMENT AND PLAN  TREATMENT PLAN FOR OBESITY:  Recommended Dietary Goals  Sritha is currently in the action stage of change. As such, her goal is to continue weight management plan. She has agreed to the Category 3 Plan.  Behavioral Intervention  We discussed the following Behavioral Modification Strategies today:  continue to work on maintaining a reduced calorie state, getting the recommended amount of protein, incorporating whole foods, making healthy choices, staying well hydrated and practicing mindfulness when eating. and increase protein intake, fibrous foods (25 grams per day for women, 30 grams for men) and water to improve satiety and decrease hunger signals. .  Additional resources provided today: NA  Recommended Physical Activity Goals  Peter has been advised to work up to 150 minutes of moderate intensity aerobic activity a week and strengthening exercises 2-3 times per week for cardiovascular health, weight loss maintenance and preservation of muscle mass.   She has agreed to Think about enjoyable ways to increase daily physical activity and overcoming barriers to exercise, Increase physical activity in their day and reduce sedentary time (increase NEAT)., Increase volume of physical activity to a goal of 240 minutes a week, and Combine aerobic and strengthening exercises for efficiency and improved cardiometabolic health.  Pharmacotherapy changes for the treatment of obesity: none   ASSOCIATED CONDITIONS ADDRESSED TODAY  Iron deficiency TIBC elevated with low ferritin and borderline low iron sat.  C/o fatigue with hx of menorrhagia and VSG.  Taking ferrous sulfate 2-3 days/ wk due to constipating SE  Will d/c ferrous sulfate and begin: -     Ferrous Gluconate ; Take 1 tablet (324 mg total) by mouth daily with breakfast.  Dispense: 90 tablet; Refill: 1  Vitamin D  deficiency -     Cholecalciferol ;  4,000 international units  daily Last vitamin D  Lab Results  Component Value Date   VD25OH 26.8 (L) 02/09/2024   New. Reviewed labs with patient Discussed how low vitamin D  can cause fatigue, poor immune function and bone loss. Currently on vitamin D  2,000 international units  daily Increase dose to 4,0000 international units  daily and repeat lab in 4-6 mos  Morbid obesity (HCC) Watch  for GERD improvements with weight loss, considering revision bariatric surgery   BMI 60.0-69.9, adult (HCC)  Second degree AV block, Mobitz type I Seen by cardiology, awaiting results of cardiac monitor and echocardiogram. Asymptomatic  S/P laparoscopic sleeve gastrectomy Continue to eat smaller meals, moving larger meals on meal plan into smaller meals while reducing junk food snacks  Prediabetes Lab Results  Component Value Date   HGBA1C 5.9 (H) 02/09/2024  Continue to work on changing diet and adding in more physical activity Consider adding metformin  Insulin  resistance Reviewed lab with patient Fasting insulin  high at 18.1 Explained how IR can contribute to weight gain, polyphagia and T2DM which she has a + fam hx of.  Work on lifestyle changes and consider GLP-1 or metformin     She was informed of the importance of frequent follow up visits to maximize her success with intensive lifestyle modifications for her multiple health conditions.   ATTESTASTION STATEMENTS:  Reviewed by clinician on day of visit: allergies, medications, problem list, medical history, surgical history, family history, social history, and previous encounter notes pertinent to obesity diagnosis.   I have personally spent 40 minutes total time today in preparation, patient care, nutritional counseling and education,  and documentation for this visit, including the following: review of most recent clinical lab tests, prescribing medications/ refilling medications, reviewing medical assistant documentation, review and interpretation of bioimpedence results.     Katherine Baldwin, D.O. DABFM, DABOM [facility] [address] [place], [place] [address] [phone]

## 2024-03-01 ENCOUNTER — Ambulatory Visit (HOSPITAL_COMMUNITY)
Admission: RE | Admit: 2024-03-01 | Discharge: 2024-03-01 | Disposition: A | Discharge status: Home / Self Care | Source: Ambulatory Visit | Attending: Cardiology | Admitting: Cardiology

## 2024-03-01 ENCOUNTER — Ambulatory Visit: Payer: Self-pay | Admitting: Cardiology

## 2024-03-01 DIAGNOSIS — R011 Cardiac murmur, unspecified: Secondary | ICD-10-CM | POA: Insufficient documentation

## 2024-03-01 DIAGNOSIS — R002 Palpitations: Secondary | ICD-10-CM | POA: Diagnosis not present

## 2024-03-01 DIAGNOSIS — I440 Atrioventricular block, first degree: Secondary | ICD-10-CM

## 2024-03-01 LAB — ECHOCARDIOGRAM COMPLETE
Area-P 1/2: 3.4 cm2
Calc EF: 59.9 %
S' Lateral: 3.7 cm
Single Plane A2C EF: 64 %
Single Plane A4C EF: 55.1 %

## 2024-03-04 ENCOUNTER — Ambulatory Visit (HOSPITAL_BASED_OUTPATIENT_CLINIC_OR_DEPARTMENT_OTHER)

## 2024-03-15 ENCOUNTER — Ambulatory Visit: Payer: Self-pay | Admitting: Cardiology

## 2024-03-15 DIAGNOSIS — R002 Palpitations: Secondary | ICD-10-CM | POA: Diagnosis not present

## 2024-03-24 ENCOUNTER — Encounter: Payer: Self-pay | Admitting: Family Medicine

## 2024-03-24 ENCOUNTER — Ambulatory Visit: Admitting: Family Medicine

## 2024-03-24 VITALS — BP 111/72 | HR 72 | Temp 98.6°F | Ht 65.0 in | Wt 362.0 lb

## 2024-03-24 DIAGNOSIS — Z6841 Body Mass Index (BMI) 40.0 and over, adult: Secondary | ICD-10-CM | POA: Diagnosis not present

## 2024-03-24 DIAGNOSIS — R110 Nausea: Secondary | ICD-10-CM

## 2024-03-24 DIAGNOSIS — Z9884 Bariatric surgery status: Secondary | ICD-10-CM

## 2024-03-24 DIAGNOSIS — M519 Unspecified thoracic, thoracolumbar and lumbosacral intervertebral disc disorder: Secondary | ICD-10-CM | POA: Diagnosis not present

## 2024-03-24 DIAGNOSIS — E611 Iron deficiency: Secondary | ICD-10-CM

## 2024-03-24 DIAGNOSIS — R7303 Prediabetes: Secondary | ICD-10-CM

## 2024-03-24 MED ORDER — ONDANSETRON 4 MG PO TBDP: 4.0000 mg | ORAL_TABLET | Freq: Three times a day (TID) | ORAL | 0 refills | Status: AC | PRN

## 2024-03-24 MED ORDER — WEGOVY 0.25 MG/0.5ML ~~LOC~~ SOAJ: 0.2500 mg | SUBCUTANEOUS | 0 refills | Status: AC

## 2024-03-24 NOTE — Progress Notes (Signed)
 "  Office: [phone]  /  Fax: [phone]  WEIGHT SUMMARY AND BIOMETRICS  Starting Date: 02/09/24  Starting Weight: 371lb   Weight Lost Since Last Visit: 0lb   Vitals Temp: 98.6 F (37 C) BP: 111/72 Pulse Rate: 72 SpO2: 100 %   Body Composition  Body Fat %: 58.1 % Fat Mass (lbs): 210.6 lbs Muscle Mass (lbs): 144.4 lbs Total Body Water (lbs): 112.6 lbs Visceral Fat Rating : 25    HPI  Chief Complaint: OBESITY  Katherine Baldwin is here to discuss her progress with her obesity treatment plan. Katherine Baldwin is on the the Category 3 Plan and states Katherine Baldwin is following her eating plan approximately 80 % of the time. Katherine Baldwin states Katherine Baldwin is exercising 45-60 minutes 2 times per week.  Interval History:  Since last office visit Katherine Baldwin is down 0 lb Katherine Baldwin did indulge a little over the holidays Katherine Baldwin has some emotional eating last week after the passing of her aunt Katherine Baldwin has a net weight loss of 11 lb  in 1 month of medically supervised weight management Katherine Baldwin has not had insurance coverage for GLP-1 RA's Katherine Baldwin is awaiting ortho follow up s/p L spine MRI done (result printed for patient) Katherine Baldwin can do some light walking on the treadmill, bike and core training Her cardiac visit was completed including echocardiogram which was within normal limits Katherine Baldwin is status post vertical sleeve gastrectomy with a preop weight of 370 pounds and a postop nadir weight of 259 pounds.  Pharmacotherapy: None Katherine Baldwin does lack insurance coverage for antiobesity medication  PHYSICAL EXAM:  Blood pressure 111/72, pulse 72, temperature 98.6 F (37 C), height 5' 5 (1.651 m), weight (!) 362 lb (164.2 kg), SpO2 100%, unknown if currently breastfeeding. Body mass index is 60.24 kg/m.  General: Katherine Baldwin is overweight, cooperative, alert, well developed, and in no acute distress. PSYCH: Has normal mood, affect and thought process.   Lungs: Normal breathing effort, no conversational dyspnea.   ASSESSMENT AND PLAN  TREATMENT PLAN FOR  OBESITY:  Recommended Dietary Goals  Katherine Baldwin is currently in the action stage of change. As such, her goal is to continue weight management plan. Katherine Baldwin has agreed to keeping a food journal and adhering to recommended goals of 1700-1800 calories and 100 g of protein.  Behavioral Intervention  We discussed the following Behavioral Modification Strategies today: increasing lean protein intake to established goals, increasing fiber rich foods, increasing water intake , work on tracking and journaling calories using tracking application, keeping healthy foods at home, continue to practice mindfulness when eating, planning for success, and avoid all-or-none thinking.  Additional resources provided today: NA  Recommended Physical Activity Goals  Katherine Baldwin has been advised to work up to 150 minutes of moderate intensity aerobic activity a week and strengthening exercises 2-3 times per week for cardiovascular health, weight loss maintenance and preservation of muscle mass.   Katherine Baldwin has agreed to Start aerobic activity with a goal of 150 minutes a week at moderate intensity.  Walking goal set for 20+ minutes 4 days a week  Pharmacotherapy changes for the treatment of obesity: Begin Wegovy  0.25 mg once weekly injection Patient denies a personal or family history of pancreatitis, medullary thyroid carcinoma or multiple endocrine neoplasia type II. Recommend reviewing pen training video online.  ASSOCIATED CONDITIONS ADDRESSED TODAY  Iron deficiency Tolerating ferrous gluconate  324 mg tab, currently taking every other day without adverse side effect Previously had constipation from ferrous sulfate At risk for iron deficiency status post bariatric surgery and with  menorrhagia Energy levels remain fairly low Try moving ferrous gluconate  324 mg tab every day and repeat iron levels in 3 to 4 months  Morbid obesity (HCC) -     Wegovy ; Inject 0.25 mg into the skin once a week.  Dispense: 2 mL; Refill: 0 Katherine Baldwin  has struggled to see improvement in appetite control with dietary change alone.  Katherine Baldwin is net down 11 pounds in 1 month medically supervised weight management but feels overwhelmed by the amount of weight Katherine Baldwin needs to lose.  Katherine Baldwin wants to avoid medication that cause mood side effects.  Avoid phentermine, Qsymia and Contrave.  Katherine Baldwin is a good candidate for use of Wegovy  0.25 mg once weekly injection via cash pay.  Patient was counseled on the importance of maintaining healthy lifestyle habits, including balanced nutrition, regular physical activity, and behavioral modifications, while taking antiobesity medication.  Patient verbalized understanding that medication is an adjunct to, not a replacement for, lifestyle changes and that the long-term success and weight maintenance depend on continued adherence to these strategies.  Nausea -     Ondansetron ; Take 1 tablet (4 mg total) by mouth every 8 (eight) hours as needed for nausea or vomiting.  Dispense: 20 tablet; Refill: 0 Prescription for Zofran  provided to use as needed due to nausea that may occur from use of Wegovy   BMI 60.0-69.9, adult (HCC)  Prediabetes Lab Results  Component Value Date   HGBA1C 5.9 (H) 02/09/2024  Continue to work on reducing starch and sugar intake, increasing walking time and weight loss.  Look for improvements with use of Wegovy  for obesity management.  Repeat A1c in 3 to 4 months  S/P laparoscopic sleeve gastrectomy Continue to work on small meals with a focus on lean protein and fiber with meals, tracking of daily calorie intake. Reviewed plan of care on after visit summary Continue a bariatric multivitamin once daily Hydrate well with water outside of mealtime  Lumbar disc disease MRI L-spine reviewed from 02/27/2024, done at [facility].  Katherine Baldwin is awaiting follow-up with orthopedics but has findings of L4 on L5 anterolisthesis and spondylosis. Katherine Baldwin has been able to do some light walking and core strengthening  exercise without exacerbating pain.  Follow-up with Ortho.     Katherine Baldwin was informed of the importance of frequent follow up visits to maximize her success with intensive lifestyle modifications for her multiple health conditions.   ATTESTASTION STATEMENTS:  Reviewed by clinician on day of visit: allergies, medications, problem list, medical history, surgical history, family history, social history, and previous encounter notes pertinent to obesity diagnosis.   I have personally spent 31 minutes total time today in preparation, patient care, nutritional counseling and education,  and documentation for this visit, including the following: review of most recent clinical lab tests, prescribing medications/ refilling medications, reviewing medical assistant documentation, review and interpretation of bioimpedence results.     Darice Haddock, D.O. DABFM, DABOM [facility] [address] [place], [place] [address] [phone] "

## 2024-03-24 NOTE — Patient Instructions (Addendum)
 Aim for 1700-1800 cal/ day using the LoseIt app This should include 100 g of protein daily Avoid high sugar foods and drinks Hydrate well with water outside of meals Daily fiber ~30 g  Remember fruits and veggies each day Barebells protein bars  Move iron gluconate to daily Walking goal: 20+ min 4 days/ wk  Check out Wegovy  cash pay options

## 2024-04-27 ENCOUNTER — Ambulatory Visit: Admitting: Family Medicine

## 2024-05-04 ENCOUNTER — Ambulatory Visit: Admitting: Family Medicine
